# Patient Record
Sex: Female | Born: 1960 | Race: Black or African American | Hispanic: No | Marital: Single | Smoking: Never smoker
Health system: Southern US, Community
[De-identification: ages and names within clinical notes are randomized; demographics above are authoritative.]

## PROBLEM LIST (undated history)

## (undated) DIAGNOSIS — E119 Type 2 diabetes mellitus without complications: Secondary | ICD-10-CM

## (undated) DIAGNOSIS — E669 Obesity, unspecified: Secondary | ICD-10-CM

## (undated) DIAGNOSIS — E785 Hyperlipidemia, unspecified: Secondary | ICD-10-CM

## (undated) DIAGNOSIS — I10 Essential (primary) hypertension: Secondary | ICD-10-CM

## (undated) HISTORY — DX: Type 2 diabetes mellitus without complications: E11.9

## (undated) HISTORY — PX: WRIST SURGERY: SHX841

## (undated) HISTORY — DX: Hyperlipidemia, unspecified: E78.5

## (undated) HISTORY — DX: Obesity, unspecified: E66.9

## (undated) HISTORY — DX: Essential (primary) hypertension: I10

## (undated) HISTORY — PX: FOOT SURGERY: SHX648

---

## 2005-07-17 ENCOUNTER — Encounter: Admission: RE | Admit: 2005-07-17 | Discharge: 2005-07-31 | Payer: Self-pay | Admitting: Family Medicine

## 2005-12-16 ENCOUNTER — Other Ambulatory Visit: Admission: RE | Admit: 2005-12-16 | Discharge: 2005-12-16 | Payer: Self-pay | Admitting: Family Medicine

## 2006-06-11 ENCOUNTER — Encounter: Admission: RE | Admit: 2006-06-11 | Discharge: 2006-07-15 | Payer: Self-pay | Admitting: Family Medicine

## 2008-04-18 ENCOUNTER — Other Ambulatory Visit: Admission: RE | Admit: 2008-04-18 | Discharge: 2008-04-18 | Payer: Self-pay | Admitting: Family Medicine

## 2008-06-15 ENCOUNTER — Encounter: Admission: RE | Admit: 2008-06-15 | Discharge: 2008-07-27 | Payer: Self-pay | Admitting: Family Medicine

## 2008-10-19 ENCOUNTER — Encounter: Admission: RE | Admit: 2008-10-19 | Discharge: 2008-10-19 | Payer: Self-pay | Admitting: Family Medicine

## 2013-01-26 ENCOUNTER — Encounter: Payer: Federal, State, Local not specified - PPO | Attending: Family Medicine | Admitting: *Deleted

## 2013-01-26 VITALS — Ht 69.0 in | Wt 295.0 lb

## 2013-01-26 DIAGNOSIS — Z713 Dietary counseling and surveillance: Secondary | ICD-10-CM | POA: Insufficient documentation

## 2013-01-26 DIAGNOSIS — E119 Type 2 diabetes mellitus without complications: Secondary | ICD-10-CM

## 2013-01-26 NOTE — Patient Instructions (Signed)
Plan:  Aim for 3 Carb Choices per meal (45 grams) +/- 1 either way  Aim for 0-15 Carbs per snack if hungry  Consider reading food labels for Total Carbohydrate and Fat Grams of foods Continue your activity level by walking for 30 minutes daily as tolerated Consider checking BG at alternate times per day to include fasting and 2 hours after the beginning of your meal as directed by MD  Continue taking medication as directed by MD

## 2013-01-26 NOTE — Progress Notes (Signed)
Appt start time: 1530 end time:  1700.  Assessment:  Patient was seen on  01/26/13 for individual diabetes education. Kristin Aguilar was diagnosis with T2DM 09/2009 at which time she met with a dietitian. She followed a meal plan and lost 40#. She notes that " I screwed up". Kristin Aguilar's nephew lives with her. She does all the shopping and cooking herself. She does occasionally go to [employer] and [employer]. Growing up her father owned 2 [employer] She tests her glucose FBS daily and occasionally test post meal. Was previously experiencing hypoglycemia while taking glyburide. Has now changed to glimeperide and avg FBS 90-145mg /dl.  Current HbA1c: 7.3%  Preferred Learning Style:   Auditory  Visual  Hands on  Learning Readiness:   Ready  Change in progress  MEDICATIONS: See List: Metformin, Glimeperide  DIETARY INTAKE:  B ( AM): sausage, bread,....small grits  Snk ( AM): crackers, fig newtons  L ( PM): baked chicken/beef roast, chili, corned beef......salads, greens, brocolli Snk ( PM): fig newton D ( PM): baked chicken/beef roast, chili, corned beef, brown rice, wheat pasta..... Salads, greens, brocolli Snk ( PM): 1/2C ice cream [employer] Strawberry Beverages: diet soda/caffeine, water, Fast food [employer] 1X every other week, [employer]  Usual physical activity: walking 15 minutes 2X daily  Intervention:  Nutrition counseling provided.  Discussed diabetes disease process and treatment options.  Discussed physiology of diabetes and role of obesity on insulin resistance.  Encouraged moderate weight reduction to improve glucose levels.  Discussed role of medications and diet in glucose control  Provided education on macronutrients on glucose levels.  Provided education on carb counting, importance of regularly scheduled meals/snacks, and meal planning  Discussed effects of physical activity on glucose levels and long-term glucose control.  Recommended 150 minutes of physical  activity/week.  Reviewed patient medications.  Discussed role of medication on blood glucose and possible side effects  Discussed blood glucose monitoring and interpretation.  Discussed recommended target ranges and individual ranges.    Described short-term complications: hyper- and hypo-glycemia.  Discussed causes,symptoms, and treatment options.  Discussed prevention, detection, and treatment of long-term complications.  Discussed the role of prolonged elevated glucose levels on body systems.  Discussed role of stress on blood glucose levels and discussed strategies to manage psychosocial issues.  Discussed recommendations for long-term diabetes self-care.  Established checklist for medical, dental, and emotional self-care.  Plan:  Aim for 3 Carb Choices per meal (45 grams) +/- 1 either way  Aim for 0-15 Carbs per snack if hungry  Consider reading food labels for Total Carbohydrate and Fat Grams of foods Continue your activity level by walking for 30 minutes daily as tolerated Consider checking BG at alternate times per day to include fasting and 2 hours after the beginning of your meal as directed by MD  Continue taking medication as directed by MD  Teaching Method Utilized:  Visual Auditory Hands on  Handouts given during visit include: Living Well with Diabetes Carb Counting and Food Label handouts Meal Plan Card Plate Method Snack sheet  Barriers to learning/adherence to lifestyle change: 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 She generally buys items that she does not like so that she will not be tempted.  Diabetes self-care support plan:   [facility] support group  PCP  Co-workers  CDE  Demonstrated degree of understanding via:  Teach Back   Monitoring/Evaluation:  Dietary intake, exercise, test glucose and log, and body weight in 2 month(s).

## 2013-01-27 ENCOUNTER — Encounter: Payer: Self-pay | Admitting: *Deleted

## 2013-03-30 ENCOUNTER — Ambulatory Visit: Payer: Federal, State, Local not specified - PPO | Admitting: *Deleted

## 2013-06-14 ENCOUNTER — Other Ambulatory Visit: Payer: Self-pay | Admitting: Family Medicine

## 2013-06-14 ENCOUNTER — Other Ambulatory Visit (HOSPITAL_COMMUNITY)
Admission: RE | Admit: 2013-06-14 | Discharge: 2013-06-14 | Disposition: A | Discharge status: Home / Self Care | Payer: Federal, State, Local not specified - PPO | Source: Ambulatory Visit | Attending: Family Medicine | Admitting: Family Medicine

## 2013-06-14 DIAGNOSIS — Z113 Encounter for screening for infections with a predominantly sexual mode of transmission: Secondary | ICD-10-CM | POA: Insufficient documentation

## 2013-06-14 DIAGNOSIS — Z01419 Encounter for gynecological examination (general) (routine) without abnormal findings: Secondary | ICD-10-CM | POA: Insufficient documentation

## 2013-06-14 DIAGNOSIS — Z1151 Encounter for screening for human papillomavirus (HPV): Secondary | ICD-10-CM | POA: Insufficient documentation

## 2013-07-15 ENCOUNTER — Other Ambulatory Visit: Payer: Self-pay | Admitting: Gastroenterology

## 2015-02-12 ENCOUNTER — Encounter (HOSPITAL_COMMUNITY): Payer: Self-pay | Admitting: *Deleted

## 2015-02-12 ENCOUNTER — Emergency Department (INDEPENDENT_AMBULATORY_CARE_PROVIDER_SITE_OTHER)
Admission: EM | Admit: 2015-02-12 | Discharge: 2015-02-12 | Disposition: A | Discharge status: Other Acute Inpatient Hospital | Payer: Federal, State, Local not specified - PPO | Source: Home / Self Care | Attending: Family Medicine | Admitting: Family Medicine

## 2015-02-12 ENCOUNTER — Emergency Department (HOSPITAL_COMMUNITY)
Admission: EM | Admit: 2015-02-12 | Discharge: 2015-02-12 | Disposition: A | Discharge status: Home / Self Care | Payer: Federal, State, Local not specified - PPO | Attending: Emergency Medicine | Admitting: Emergency Medicine

## 2015-02-12 ENCOUNTER — Emergency Department (HOSPITAL_COMMUNITY): Payer: Federal, State, Local not specified - PPO

## 2015-02-12 DIAGNOSIS — R194 Change in bowel habit: Secondary | ICD-10-CM | POA: Diagnosis not present

## 2015-02-12 DIAGNOSIS — E785 Hyperlipidemia, unspecified: Secondary | ICD-10-CM | POA: Insufficient documentation

## 2015-02-12 DIAGNOSIS — R109 Unspecified abdominal pain: Secondary | ICD-10-CM

## 2015-02-12 DIAGNOSIS — R197 Diarrhea, unspecified: Secondary | ICD-10-CM | POA: Diagnosis not present

## 2015-02-12 DIAGNOSIS — D259 Leiomyoma of uterus, unspecified: Secondary | ICD-10-CM | POA: Diagnosis not present

## 2015-02-12 DIAGNOSIS — N289 Disorder of kidney and ureter, unspecified: Secondary | ICD-10-CM | POA: Insufficient documentation

## 2015-02-12 DIAGNOSIS — E1165 Type 2 diabetes mellitus with hyperglycemia: Secondary | ICD-10-CM | POA: Insufficient documentation

## 2015-02-12 DIAGNOSIS — N83202 Unspecified ovarian cyst, left side: Secondary | ICD-10-CM | POA: Insufficient documentation

## 2015-02-12 DIAGNOSIS — D72829 Elevated white blood cell count, unspecified: Secondary | ICD-10-CM | POA: Insufficient documentation

## 2015-02-12 DIAGNOSIS — I10 Essential (primary) hypertension: Secondary | ICD-10-CM | POA: Insufficient documentation

## 2015-02-12 DIAGNOSIS — E669 Obesity, unspecified: Secondary | ICD-10-CM | POA: Diagnosis not present

## 2015-02-12 DIAGNOSIS — Z3202 Encounter for pregnancy test, result negative: Secondary | ICD-10-CM | POA: Insufficient documentation

## 2015-02-12 DIAGNOSIS — Z79899 Other long term (current) drug therapy: Secondary | ICD-10-CM | POA: Insufficient documentation

## 2015-02-12 DIAGNOSIS — Z7982 Long term (current) use of aspirin: Secondary | ICD-10-CM | POA: Insufficient documentation

## 2015-02-12 DIAGNOSIS — Z7984 Long term (current) use of oral hypoglycemic drugs: Secondary | ICD-10-CM | POA: Insufficient documentation

## 2015-02-12 LAB — COMPREHENSIVE METABOLIC PANEL
ALK PHOS: 93 U/L (ref 38–126)
ALT: 19 U/L (ref 14–54)
AST: 15 U/L (ref 15–41)
Albumin: 3.7 g/dL (ref 3.5–5.0)
Anion gap: 9 (ref 5–15)
BILIRUBIN TOTAL: 0.5 mg/dL (ref 0.3–1.2)
BUN: 11 mg/dL (ref 6–20)
CALCIUM: 9.4 mg/dL (ref 8.9–10.3)
CO2: 29 mmol/L (ref 22–32)
CREATININE: 1.04 mg/dL — AB (ref 0.44–1.00)
Chloride: 101 mmol/L (ref 101–111)
GFR, EST NON AFRICAN AMERICAN: 60 mL/min — AB (ref >60)
Glucose, Bld: 122 mg/dL — ABNORMAL HIGH (ref 65–99)
Potassium: 3.5 mmol/L (ref 3.5–5.1)
SODIUM: 139 mmol/L (ref 135–145)
TOTAL PROTEIN: 7.2 g/dL (ref 6.5–8.1)

## 2015-02-12 LAB — I-STAT BETA HCG BLOOD, ED (MC, WL, AP ONLY)

## 2015-02-12 LAB — URINE MICROSCOPIC-ADD ON

## 2015-02-12 LAB — URINALYSIS, ROUTINE W REFLEX MICROSCOPIC
Glucose, UA: NEGATIVE mg/dL
Hgb urine dipstick: NEGATIVE
KETONES UR: 15 mg/dL — AB
Nitrite: NEGATIVE
PROTEIN: NEGATIVE mg/dL
Specific Gravity, Urine: 1.021 (ref 1.005–1.030)
pH: 5.5 (ref 5.0–8.0)

## 2015-02-12 LAB — CBC
HCT: 44.1 % (ref 36.0–46.0)
Hemoglobin: 14.2 g/dL (ref 12.0–15.0)
MCH: 30 pg (ref 26.0–34.0)
MCHC: 32.2 g/dL (ref 30.0–36.0)
MCV: 93.2 fL (ref 78.0–100.0)
PLATELETS: 322 10*3/uL (ref 150–400)
RBC: 4.73 MIL/uL (ref 3.87–5.11)
RDW: 12.3 % (ref 11.5–15.5)
WBC: 11.3 10*3/uL — AB (ref 4.0–10.5)

## 2015-02-12 LAB — LIPASE, BLOOD: Lipase: 32 U/L (ref 11–51)

## 2015-02-12 MED ORDER — DICYCLOMINE HCL 20 MG PO TABS: 20.0000 mg | ORAL_TABLET | Freq: Three times a day (TID) | ORAL | Status: DC | PRN

## 2015-02-12 MED ORDER — IOHEXOL 300 MG/ML  SOLN
100.0000 mL | Freq: Once | INTRAMUSCULAR | Status: AC | PRN
  Administered 2015-02-12: 100 mL via INTRAVENOUS

## 2015-02-12 MED ORDER — SODIUM CHLORIDE 0.9 % IV BOLUS (SEPSIS)
1000.0000 mL | Freq: Once | INTRAVENOUS | Status: AC
  Administered 2015-02-12: 1000 mL via INTRAVENOUS

## 2015-02-12 MED ORDER — PSYLLIUM 28 % PO PACK: 1.0000 | PACK | Freq: Two times a day (BID) | ORAL | Status: AC

## 2015-02-12 MED ORDER — PSYLLIUM 28 % PO PACK: 1.0000 | PACK | Freq: Two times a day (BID) | ORAL | Status: DC

## 2015-02-12 NOTE — Discharge Instructions (Signed)
Read the information below.  Use the prescribed medication as directed.  Please discuss all new medications with your pharmacist.  You may return to the Emergency Department at any time for worsening condition or any new symptoms that concern you.    If you develop high fevers, worsening abdominal pain, uncontrolled vomiting, or are unable to tolerate fluids by mouth, return to the ER for a recheck.     Abdominal Pain, Adult Many things can cause abdominal pain. Usually, abdominal pain is not caused by a disease and will improve without treatment. It can often be observed and treated at home. Your health care provider will do a physical exam and possibly order blood tests and X-rays to help determine the seriousness of your pain. However, in many cases, more time must pass before a clear cause of the pain can be found. Before that point, your health care provider may not know if you need more testing or further treatment. HOME CARE INSTRUCTIONS Monitor your abdominal pain for any changes. The following actions may help to alleviate any discomfort you are experiencing:  Only take over-the-counter or prescription medicines as directed by your health care provider.  Do not take laxatives unless directed to do so by your health care provider.  Try a clear liquid diet (broth, tea, or water) as directed by your health care provider. Slowly move to a bland diet as tolerated. SEEK MEDICAL CARE IF:  You have unexplained abdominal pain.  You have abdominal pain associated with nausea or diarrhea.  You have pain when you urinate or have a bowel movement.  You experience abdominal pain that wakes you in the night.  You have abdominal pain that is worsened or improved by eating food.  You have abdominal pain that is worsened with eating fatty foods.  You have a fever. SEEK IMMEDIATE MEDICAL CARE IF:  Your pain does not go away within 2 hours.  You keep throwing up (vomiting).  Your pain is felt  only in portions of the abdomen, such as the right side or the left lower portion of the abdomen.  You pass bloody or black tarry stools. MAKE SURE YOU:  Understand these instructions.  Will watch your condition.  Will get help right away if you are not doing well or get worse.   This information is not intended to replace advice given to you by your health care provider. Make sure you discuss any questions you have with your health care provider.   Document Released: 11/07/2004 Document Revised: 10/19/2014 Document Reviewed: 10/07/2012 Elsevier Interactive Patient Education [employer].

## 2015-02-12 NOTE — ED Notes (Signed)
Pt sent here from [facility] for abd pain since wed. Denies n/v or urinary symptoms, reports mild diarrhea. No acute distress noted at triage.

## 2015-02-12 NOTE — ED Notes (Addendum)
Pt  Reports       Symptoms      Of    abd  Pain   -  Pt     Reports            Symptoms         Of        abd  Pain           With  Some  Nausea          And          Weakness          X  4-5   Days            Pt    Reports      Family  Member  Is  Ill  As  Well

## 2015-02-12 NOTE — ED Provider Notes (Signed)
CSN: [id number]     Arrival date & time 02/12/15  1800 History   First MD Initiated Contact with Patient 02/12/15 2025     Chief Complaint  Patient presents with  . Abdominal Pain     (Consider location/radiation/quality/duration/timing/severity/associated sxs/prior Treatment) The history is provided by the patient and medical records.     Pt with hx DM, obesity, HTN, HLD p/w left sided abdominal pain initially with diarrhea and now constipation x 5 days.  Pain is aching and knotted, constant, worse with eating.  5/10 currently, 8/10 at its worst.  Last Bm was yesterday morning and was very small.  Blood sugars are normally controlled between 70-80 and have been >200.  Has had some lower back pain and SOB with moving around.  Denies fevers, chills, myalgias, CP, URI symptoms, N/V, urinary or vaginal symptoms.  She has never had abdominal surgery.    Past Medical History  Diagnosis Date  . Diabetes mellitus without complication ([place])   . Hyperlipidemia   . Hypertension   . Obesity    History reviewed. No pertinent past surgical history. History reviewed. No pertinent family history. Social History  Substance Use Topics  . Smoking status: None  . Smokeless tobacco: None  . Alcohol Use: No   OB History    No data available     Review of Systems  All other systems reviewed and are negative.     Allergies  Lactose intolerance (gi); Lisinopril; Sulfa antibiotics; and Doxycycline hyclate  Home Medications   Prior to Admission medications   Medication Sig Start Date End Date Taking? Authorizing Provider  aspirin 81 MG tablet Take 81 mg by mouth daily.    Historical Provider, MD  glimepiride (AMARYL) 2 MG tablet Take 2 mg by mouth daily with breakfast.    Historical Provider, MD  losartan (COZAAR) 25 MG tablet Take 25 mg by mouth daily.    Historical Provider, MD  metformin (FORTAMET) 500 MG (OSM) 24 hr tablet Take 1,000 mg by mouth daily with supper.    Historical Provider,  MD  rosuvastatin (CRESTOR) 10 MG tablet Take 10 mg by mouth daily.    Historical Provider, MD   BP 180/79 mmHg  Pulse 86  Temp(Src) 97.8 F (36.6 C) (Oral)  Resp 16  SpO2 100%  LMP 01/26/2015 Physical Exam  Constitutional: She appears well-developed and well-nourished. No distress.  HENT:  Head: Normocephalic and atraumatic.  Neck: Neck supple.  Cardiovascular: Normal rate and regular rhythm.   Pulmonary/Chest: Effort normal and breath sounds normal. No respiratory distress. She has no wheezes. She has no rales.  Abdominal: Soft. She exhibits no distension. There is tenderness in the left upper quadrant and left lower quadrant. There is no rebound, no guarding and no CVA tenderness.  Neurological: She is alert.  Skin: She is not diaphoretic.  Nursing note and vitals reviewed.   ED Course  Procedures (including critical care time) Labs Review Labs Reviewed  COMPREHENSIVE METABOLIC PANEL - Abnormal; Notable for the following:    Glucose, Bld 122 (*)    Creatinine, Ser 1.04 (*)    GFR calc non Af Amer 60 (*)    All other components within normal limits  CBC - Abnormal; Notable for the following:    WBC 11.3 (*)    All other components within normal limits  URINALYSIS, ROUTINE W REFLEX MICROSCOPIC (NOT AT [facility]) - Abnormal; Notable for the following:    Color, Urine AMBER (*)    APPearance CLOUDY (*)  Bilirubin Urine SMALL (*)    Ketones, ur 15 (*)    Leukocytes, UA TRACE (*)    All other components within normal limits  URINE MICROSCOPIC-ADD ON - Abnormal; Notable for the following:    Squamous Epithelial / LPF 6-30 (*)    Bacteria, UA MANY (*)    All other components within normal limits  LIPASE, BLOOD  I-STAT BETA HCG BLOOD, ED ([facility], [facility], [facility] ONLY)    Imaging Review Ct Abdomen Pelvis W Contrast  02/12/2015  CLINICAL DATA:  Subacute onset of left upper quadrant and left lower quadrant abdominal pain and diarrhea. Initial encounter. EXAM: CT ABDOMEN AND PELVIS WITH  CONTRAST TECHNIQUE: Multidetector CT imaging of the abdomen and pelvis was performed using the standard protocol following bolus administration of intravenous contrast. CONTRAST:  136mL OMNIPAQUE IOHEXOL 300 MG/ML  SOLN COMPARISON:  None. FINDINGS: The visualized lung bases are clear. A 5 mm nonspecific hypodensity is noted within the right hepatic lobe. The liver is otherwise unremarkable. The gallbladder is within normal limits. The pancreas and adrenal glands are unremarkable. The kidneys are unremarkable in appearance. There is no evidence of hydronephrosis. No renal or ureteral stones are seen. No perinephric stranding is appreciated. No free fluid is identified. The small bowel is unremarkable in appearance. The stomach is within normal limits. No acute vascular abnormalities are seen. The appendix is normal in caliber and contains air, without evidence of appendicitis. The colon is unremarkable in appearance. The bladder is mildly distended and grossly unremarkable. A large 7.4 cm fibroid is noted at the uterine fundus. The intrauterine device is significantly displaced as a result. A 3.2 cm left adnexal cystic focus is noted. The ovaries are otherwise unremarkable. No inguinal lymphadenopathy is seen. No acute osseous abnormalities are identified. Mild endplate degenerative change is noted at L4-L5. IMPRESSION: 1. No acute abnormality seen within the abdomen or pelvis. 2. Large 7.4 cm uterine fibroid noted. Intrauterine device is significantly displaced as a result. 3. 3.2 cm left adnexal cystic focus is likely physiologic, though pelvic ultrasound could be considered for further evaluation on an elective nonemergent basis. 4. 5 mm nonspecific hypodensity within the right hepatic lobe. Electronically Signed   By: Garald Balding M.D.   On: 02/12/2015 22:47      EKG Interpretation None      MDM   Final diagnoses:  Left sided abdominal pain  Uterine leiomyoma, unspecified location  Left ovarian  cyst  Bowel habit changes    Afebrile nontoxic patient with 5 days of left sided abdominal pain, diarrhea and now constipation sent from urgent care.  Labs significant for mild leukocytosis (11.3), hyperglycemia (122), renal insufficiency (creatinine 1.04).  Given IVF.  Pt declined pain medications.  CT abd/pelvis negative for acute findings.  Pt does have fibroid, left ovarian cyst and 30mm nonspecific hypodensity in liver, which I have discussed with patient.  Pt's PCP also acts as her gyn, will follow up with her.  D/C home with bentyl, metamucil, PCP follow up.  Discussed result, findings, treatment, and follow up  with patient.  Pt given return precautions.  Pt verbalizes understanding and agrees with plan.         [place], PA-C 02/13/15 [id number]  Varney Biles, MD 02/15/15 2348

## 2015-02-12 NOTE — ED Provider Notes (Signed)
CSN: [id number]     Arrival date & time 02/12/15  1435 History   First MD Initiated Contact with Patient 02/12/15 1659     Chief Complaint  Patient presents with  . Abdominal Pain   (Consider location/radiation/quality/duration/timing/severity/associated sxs/prior Treatment) Patient is a 55 y.o. female presenting with abdominal pain. The history is provided by the patient.  Abdominal Pain Pain location:  L flank Pain quality: cramping   Pain radiates to:  Does not radiate Pain severity:  Moderate Onset quality:  Sudden Duration:  5 days Progression:  Worsening Chronicity:  New Ineffective treatments:  Liquids and palpation Associated symptoms: diarrhea   Associated symptoms: no constipation, no dysuria, no fever, no hematemesis, no hematochezia, no melena, no nausea and no vomiting   Risk factors: obesity   Risk factors comment:  Diabetic with elevated sugars.   Past Medical History  Diagnosis Date  . Diabetes mellitus without complication ([place])   . Hyperlipidemia   . Hypertension   . Obesity    History reviewed. No pertinent past surgical history. History reviewed. No pertinent family history. Social History  Substance Use Topics  . Smoking status: None  . Smokeless tobacco: None  . Alcohol Use: No   OB History    No data available     Review of Systems  Constitutional: Positive for appetite change. Negative for fever.  HENT: Negative.   Respiratory: Negative.   Cardiovascular: Negative.   Gastrointestinal: Positive for abdominal pain and diarrhea. Negative for nausea, vomiting, constipation, blood in stool, melena, hematochezia and hematemesis.  Genitourinary: Negative for dysuria.  All other systems reviewed and are negative.   Allergies  Lactose intolerance (gi); Lisinopril; Sulfa antibiotics; and Doxycycline hyclate  Home Medications   Prior to Admission medications   Medication Sig Start Date End Date Taking? Authorizing Provider  aspirin 81 MG tablet  Take 81 mg by mouth daily.    Historical Provider, MD  glimepiride (AMARYL) 2 MG tablet Take 2 mg by mouth daily with breakfast.    Historical Provider, MD  losartan (COZAAR) 25 MG tablet Take 25 mg by mouth daily.    Historical Provider, MD  metformin (FORTAMET) 500 MG (OSM) 24 hr tablet Take 1,000 mg by mouth daily with supper.    Historical Provider, MD  rosuvastatin (CRESTOR) 10 MG tablet Take 10 mg by mouth daily.    Historical Provider, MD   Meds Ordered and Administered this Visit  Medications - No data to display  BP 168/88 mmHg  Pulse 79  Temp(Src) 98 F (36.7 C) (Oral)  Resp 20  SpO2 100%  LMP 01/26/2015 No data found.   Physical Exam  Constitutional: She is oriented to person, place, and time. She appears well-developed and well-nourished. She appears distressed.  Neck: Normal range of motion. Neck supple.  Cardiovascular: Normal heart sounds.   Pulmonary/Chest: Breath sounds normal.  Abdominal: Soft. Bowel sounds are normal. She exhibits no mass. There is tenderness in the left upper quadrant and left lower quadrant. There is no rebound, no guarding and no CVA tenderness.  Lymphadenopathy:    She has no cervical adenopathy.  Neurological: She is alert and oriented to person, place, and time.  Skin: Skin is warm and dry.  Nursing note and vitals reviewed.   ED Course  Procedures (including critical care time)  Labs Review Labs Reviewed - No data to display  Imaging Review No results found.   Visual Acuity Review  Right Eye Distance:   Left Eye Distance:  Bilateral Distance:    Right Eye Near:   Left Eye Near:    Bilateral Near:         MDM   1. Abdominal pain in female patient    Sent for eval of persistent left abd pain after 1 day of diarrhea on wed, no vomiting, no fever, is diabetic with bs out of control, in spite of dietary measures.    Billy Fischer, MD 02/12/15 [phone]

## 2015-02-12 NOTE — ED Notes (Signed)
PA at bedside.

## 2015-04-01 ENCOUNTER — Emergency Department (HOSPITAL_COMMUNITY)
Admission: EM | Admit: 2015-04-01 | Discharge: 2015-04-01 | Disposition: A | Discharge status: Home / Self Care | Payer: Federal, State, Local not specified - PPO | Source: Home / Self Care | Attending: Emergency Medicine | Admitting: Emergency Medicine

## 2015-04-01 ENCOUNTER — Encounter (HOSPITAL_COMMUNITY): Payer: Self-pay | Admitting: *Deleted

## 2015-04-01 DIAGNOSIS — M542 Cervicalgia: Secondary | ICD-10-CM

## 2015-04-01 DIAGNOSIS — M6248 Contracture of muscle, other site: Secondary | ICD-10-CM

## 2015-04-01 DIAGNOSIS — M62838 Other muscle spasm: Secondary | ICD-10-CM

## 2015-04-01 MED ORDER — DICLOFENAC SODIUM 75 MG PO TBEC: 75.0000 mg | DELAYED_RELEASE_TABLET | Freq: Two times a day (BID) | ORAL | Status: AC

## 2015-04-01 MED ORDER — METAXALONE 800 MG PO TABS: 800.0000 mg | ORAL_TABLET | Freq: Three times a day (TID) | ORAL | Status: AC

## 2015-04-01 MED ORDER — HYDROCODONE-ACETAMINOPHEN 5-325 MG PO TABS: 2.0000 | ORAL_TABLET | ORAL | Status: AC | PRN

## 2015-04-01 NOTE — ED Provider Notes (Signed)
HPI  SUBJECTIVE:  Kristin Aguilar is a 55 y.o. female who presents with several days of constant right-sided neck pain described as soreness, muscle spasm. She reports occasional radiation down to her lateral shoulder. Symptoms are worse with turning her head to the right and left, slightly better with heat. She tried ointment, heat pad, ice, Aleve, Tylenol arthritis, home massage without much improvement. No nausea, vomiting, fevers, trauma to her neck. No numbness, tingling, arm or grip weakness. No rash. Past medical history of "neck arthritis" states this is similar to previous flares, but more intense than usual. States usually symptoms resolve with physical therapy, chiropractor. also PMH of diabetes, hypertension. No history of cancer, osteoporosis. PMD: Cari Caraway.   Past Medical History  Diagnosis Date  . Diabetes mellitus without complication ([place])   . Hyperlipidemia   . Hypertension   . Obesity     Past Surgical History  Procedure Laterality Date  . Foot surgery    . Wrist surgery      No family history on file.  Social History  Substance Use Topics  . Smoking status: Never Smoker   . Smokeless tobacco: None  . Alcohol Use: No    No current facility-administered medications for this encounter.  Current outpatient prescriptions:  .  aspirin 81 MG tablet, Take 81 mg by mouth daily., Disp: , Rfl:  .  BIOTIN PO, Take 1 tablet by mouth daily., Disp: , Rfl:  .  Calcium Citrate-Vitamin D (CALCIUM CITRATE+D3 PETITES PO), Take 1 tablet by mouth daily., Disp: , Rfl:  .  DRYSOL 20 % external solution, Apply 1 application topically every evening., Disp: , Rfl: 6 .  fluticasone (FLONASE) 50 MCG/ACT nasal spray, Place 1 spray into both nostrils daily., Disp: , Rfl:  .  glimepiride (AMARYL) 2 MG tablet, Take 2 mg by mouth daily with breakfast., Disp: , Rfl:  .  losartan (COZAAR) 100 MG tablet, Take 100 mg by mouth daily., Disp: , Rfl: 6 .  metformin (FORTAMET) 500 MG (OSM) 24 hr  tablet, Take 1,000 mg by mouth daily with supper., Disp: , Rfl:  .  Multiple Vitamin (MULTIVITAMIN) tablet, Take 1 tablet by mouth daily., Disp: , Rfl:  .  psyllium (METAMUCIL SMOOTH TEXTURE) 28 % packet, Take 1 packet by mouth 2 (two) times daily., Disp: 30 packet, Rfl: 0 .  rosuvastatin (CRESTOR) 20 MG tablet, Take 10 mg by mouth daily., Disp: , Rfl: 6 .  diclofenac (VOLTAREN) 75 MG EC tablet, Take 1 tablet (75 mg total) by mouth 2 (two) times daily. Take with food, Disp: 30 tablet, Rfl: 0 .  HYDROcodone-acetaminophen (NORCO/VICODIN) 5-325 MG tablet, Take 2 tablets by mouth every 4 (four) hours as needed for moderate pain., Disp: 20 tablet, Rfl: 0 .  metaxalone (SKELAXIN) 800 MG tablet, Take 1 tablet (800 mg total) by mouth 3 (three) times daily., Disp: 21 tablet, Rfl: 0 .  [DISCONTINUED] dicyclomine (BENTYL) 20 MG tablet, Take 1 tablet (20 mg total) by mouth 3 (three) times daily as needed for spasms (abdominal pain)., Disp: 20 tablet, Rfl: 0  Allergies  Allergen Reactions  . Lactose Intolerance (Gi) Diarrhea  . Lisinopril Cough  . Sulfa Antibiotics Hives    "[place]"  . Doxycycline Hyclate Rash     ROS  As noted in HPI.   Physical Exam  BP 166/104 mmHg  Pulse 106  Temp(Src) 99.1 F (37.3 C) (Oral)  SpO2 96%  LMP 03/07/2015 (Exact Date)  BP Readings from Last 3 Encounters:  04/01/15 166/104  02/12/15 156/82  02/12/15 168/88    Constitutional: Well developed, well nourished, no acute distress Eyes:  EOMI, conjunctiva normal bilaterally HENT: Normocephalic, atraumatic,mucus membranes moist Respiratory: Normal inspiratory effort Cardiovascular: Normal rate GI: nondistended skin: No rash, skin intact Musculoskeletal: Right trapezial spasm, tenderness. No tenderness along C-spine.  pain aggravated with head rotation to the right more than the left. Patient able flex/extend neck fully. No rash. Shoulder exam full range of motion intact, right elbow, proximal, distal arm exam  normal. grip strength equal bilaterally. RP 2+ and equal bilaterally. Neurologic : Alert & oriented x 3, no focal neuro deficits Psychiatric: Speech and behavior appropriate   ED Course   Medications - No data to display  Orders Placed This Encounter  Procedures  . Ambulatory referral to Physical Therapy    Referral Priority:  Urgent    Referral Type:  Physical Medicine    Referral Reason:  Specialty Services Required    Requested Specialty:  Physical Therapy    Number of Visits Requested:  1    No results found for this or any previous visit (from the past 24 hour(s)). No results found.  ED Clinical Impression  Trapezius muscle spasm  Neck pain   ED Assessment/Plan  In the absence of trauma or neurologic findings, deferring imaging today. Home with Norco, diclofenac, muscle relaxant, referral to physical therapy. She is to follow-up with her primary care physician and her chiropractor. Advised continued massage.    Blood pressure noted. Repeat blood pressure 160s over 100s. Patient has no symptoms of hypertensive emergency. Feel this is most likely from pain. Advised patient to keep an eye on her blood pressure and discussed signs and symptoms of hypertensive emergency that should prompt return to the ER.   Discussed MDM, plan and followup with patient. Discussed sn/sx that should prompt return to the UC or ED. Patient agrees with plan.   *This clinic note was created using Dragon dictation software. Therefore, there may be occasional mistakes despite careful proofreading.  ?    Melynda Ripple, MD 04/01/15 [phone]

## 2015-04-01 NOTE — ED Notes (Signed)
C/O severe flare-up neck arthritis since 2/15.  C/O right neck pain radiating down into right shoulder.  Has tried heating pad, massage tool, Tyl, Aleve, and pain relief cream without relief.  Denies any parasthesias.

## 2015-04-01 NOTE — Discharge Instructions (Signed)
Stop the Aleve. Try the diclofenac. Norco for severe pain only. Follow-up with your chiropractor, primary care physician, in physical therapy. They should call you within several days to schedule an appointment.

## 2017-04-07 DIAGNOSIS — M50322 Other cervical disc degeneration at C5-C6 level: Secondary | ICD-10-CM | POA: Diagnosis not present

## 2017-04-07 DIAGNOSIS — M9902 Segmental and somatic dysfunction of thoracic region: Secondary | ICD-10-CM | POA: Diagnosis not present

## 2017-04-07 DIAGNOSIS — M50321 Other cervical disc degeneration at C4-C5 level: Secondary | ICD-10-CM | POA: Diagnosis not present

## 2017-04-07 DIAGNOSIS — M9901 Segmental and somatic dysfunction of cervical region: Secondary | ICD-10-CM | POA: Diagnosis not present

## 2017-04-08 DIAGNOSIS — K080 Exfoliation of teeth due to systemic causes: Secondary | ICD-10-CM | POA: Diagnosis not present

## 2017-05-07 DIAGNOSIS — L718 Other rosacea: Secondary | ICD-10-CM | POA: Diagnosis not present

## 2017-05-07 DIAGNOSIS — L821 Other seborrheic keratosis: Secondary | ICD-10-CM | POA: Diagnosis not present

## 2017-05-07 DIAGNOSIS — L239 Allergic contact dermatitis, unspecified cause: Secondary | ICD-10-CM | POA: Diagnosis not present

## 2017-05-07 DIAGNOSIS — D2372 Other benign neoplasm of skin of left lower limb, including hip: Secondary | ICD-10-CM | POA: Diagnosis not present

## 2017-05-20 DIAGNOSIS — M205X2 Other deformities of toe(s) (acquired), left foot: Secondary | ICD-10-CM | POA: Diagnosis not present

## 2017-05-20 DIAGNOSIS — M21961 Unspecified acquired deformity of right lower leg: Secondary | ICD-10-CM | POA: Diagnosis not present

## 2017-05-20 DIAGNOSIS — M79671 Pain in right foot: Secondary | ICD-10-CM | POA: Diagnosis not present

## 2017-05-20 DIAGNOSIS — M79672 Pain in left foot: Secondary | ICD-10-CM | POA: Diagnosis not present

## 2017-05-21 DIAGNOSIS — E119 Type 2 diabetes mellitus without complications: Secondary | ICD-10-CM | POA: Diagnosis not present

## 2017-05-21 DIAGNOSIS — Z6841 Body Mass Index (BMI) 40.0 and over, adult: Secondary | ICD-10-CM | POA: Diagnosis not present

## 2017-05-21 DIAGNOSIS — Z7984 Long term (current) use of oral hypoglycemic drugs: Secondary | ICD-10-CM | POA: Diagnosis not present

## 2017-05-21 DIAGNOSIS — R0602 Shortness of breath: Secondary | ICD-10-CM | POA: Diagnosis not present

## 2017-05-23 ENCOUNTER — Other Ambulatory Visit: Payer: Self-pay | Admitting: Surgical Oncology

## 2017-05-23 DIAGNOSIS — K449 Diaphragmatic hernia without obstruction or gangrene: Secondary | ICD-10-CM

## 2017-05-27 ENCOUNTER — Ambulatory Visit
Admission: RE | Admit: 2017-05-27 | Discharge: 2017-05-27 | Disposition: A | Discharge status: Home / Self Care | Payer: Federal, State, Local not specified - PPO | Source: Ambulatory Visit | Attending: Surgical Oncology | Admitting: Surgical Oncology

## 2017-05-27 DIAGNOSIS — K449 Diaphragmatic hernia without obstruction or gangrene: Secondary | ICD-10-CM | POA: Diagnosis not present

## 2017-06-02 DIAGNOSIS — M205X2 Other deformities of toe(s) (acquired), left foot: Secondary | ICD-10-CM | POA: Diagnosis not present

## 2017-06-02 DIAGNOSIS — M7752 Other enthesopathy of left foot: Secondary | ICD-10-CM | POA: Diagnosis not present

## 2017-06-03 DIAGNOSIS — Z713 Dietary counseling and surveillance: Secondary | ICD-10-CM | POA: Diagnosis not present

## 2017-06-03 DIAGNOSIS — Z6841 Body Mass Index (BMI) 40.0 and over, adult: Secondary | ICD-10-CM | POA: Diagnosis not present

## 2017-06-24 DIAGNOSIS — Z713 Dietary counseling and surveillance: Secondary | ICD-10-CM | POA: Diagnosis not present

## 2017-07-22 DIAGNOSIS — E139 Other specified diabetes mellitus without complications: Secondary | ICD-10-CM | POA: Diagnosis not present

## 2017-07-22 DIAGNOSIS — M7752 Other enthesopathy of left foot: Secondary | ICD-10-CM | POA: Diagnosis not present

## 2017-07-22 DIAGNOSIS — M205X2 Other deformities of toe(s) (acquired), left foot: Secondary | ICD-10-CM | POA: Diagnosis not present

## 2017-07-24 DIAGNOSIS — Z6841 Body Mass Index (BMI) 40.0 and over, adult: Secondary | ICD-10-CM | POA: Diagnosis not present

## 2017-07-24 DIAGNOSIS — Z713 Dietary counseling and surveillance: Secondary | ICD-10-CM | POA: Diagnosis not present

## 2017-08-04 DIAGNOSIS — Z1231 Encounter for screening mammogram for malignant neoplasm of breast: Secondary | ICD-10-CM | POA: Diagnosis not present

## 2017-08-11 DIAGNOSIS — Z6841 Body Mass Index (BMI) 40.0 and over, adult: Secondary | ICD-10-CM | POA: Diagnosis not present

## 2017-08-11 DIAGNOSIS — Z634 Disappearance and death of family member: Secondary | ICD-10-CM | POA: Diagnosis not present

## 2017-08-11 DIAGNOSIS — Z01818 Encounter for other preprocedural examination: Secondary | ICD-10-CM | POA: Diagnosis not present

## 2017-08-11 DIAGNOSIS — F4320 Adjustment disorder, unspecified: Secondary | ICD-10-CM | POA: Diagnosis not present

## 2017-08-11 DIAGNOSIS — R948 Abnormal results of function studies of other organs and systems: Secondary | ICD-10-CM | POA: Diagnosis not present

## 2017-08-25 DIAGNOSIS — Z713 Dietary counseling and surveillance: Secondary | ICD-10-CM | POA: Diagnosis not present

## 2017-08-25 DIAGNOSIS — Z112 Encounter for screening for other bacterial diseases: Secondary | ICD-10-CM | POA: Diagnosis not present

## 2017-08-25 DIAGNOSIS — Z6841 Body Mass Index (BMI) 40.0 and over, adult: Secondary | ICD-10-CM | POA: Diagnosis not present

## 2017-08-29 DIAGNOSIS — I10 Essential (primary) hypertension: Secondary | ICD-10-CM | POA: Diagnosis not present

## 2017-08-29 DIAGNOSIS — Z0000 Encounter for general adult medical examination without abnormal findings: Secondary | ICD-10-CM | POA: Diagnosis not present

## 2017-08-29 DIAGNOSIS — E559 Vitamin D deficiency, unspecified: Secondary | ICD-10-CM | POA: Diagnosis not present

## 2017-08-29 DIAGNOSIS — E1159 Type 2 diabetes mellitus with other circulatory complications: Secondary | ICD-10-CM | POA: Diagnosis not present

## 2017-08-29 DIAGNOSIS — E785 Hyperlipidemia, unspecified: Secondary | ICD-10-CM | POA: Diagnosis not present

## 2017-09-11 DIAGNOSIS — Z634 Disappearance and death of family member: Secondary | ICD-10-CM | POA: Diagnosis not present

## 2017-09-11 DIAGNOSIS — F4320 Adjustment disorder, unspecified: Secondary | ICD-10-CM | POA: Diagnosis not present

## 2017-10-21 DIAGNOSIS — I10 Essential (primary) hypertension: Secondary | ICD-10-CM | POA: Diagnosis not present

## 2017-10-21 DIAGNOSIS — R0683 Snoring: Secondary | ICD-10-CM | POA: Diagnosis not present

## 2017-10-21 DIAGNOSIS — G4700 Insomnia, unspecified: Secondary | ICD-10-CM | POA: Diagnosis not present

## 2017-10-23 DIAGNOSIS — F54 Psychological and behavioral factors associated with disorders or diseases classified elsewhere: Secondary | ICD-10-CM | POA: Diagnosis not present

## 2017-10-23 DIAGNOSIS — F4329 Adjustment disorder with other symptoms: Secondary | ICD-10-CM | POA: Diagnosis not present

## 2017-10-23 DIAGNOSIS — Z6841 Body Mass Index (BMI) 40.0 and over, adult: Secondary | ICD-10-CM | POA: Diagnosis not present

## 2017-11-22 DIAGNOSIS — G4733 Obstructive sleep apnea (adult) (pediatric): Secondary | ICD-10-CM | POA: Diagnosis not present

## 2017-11-22 DIAGNOSIS — Z9189 Other specified personal risk factors, not elsewhere classified: Secondary | ICD-10-CM | POA: Diagnosis not present

## 2017-11-22 DIAGNOSIS — I10 Essential (primary) hypertension: Secondary | ICD-10-CM | POA: Diagnosis not present

## 2017-12-12 DIAGNOSIS — K080 Exfoliation of teeth due to systemic causes: Secondary | ICD-10-CM | POA: Diagnosis not present

## 2017-12-29 DIAGNOSIS — R05 Cough: Secondary | ICD-10-CM | POA: Diagnosis not present

## 2017-12-29 DIAGNOSIS — J069 Acute upper respiratory infection, unspecified: Secondary | ICD-10-CM | POA: Diagnosis not present

## 2018-01-06 DIAGNOSIS — Z01818 Encounter for other preprocedural examination: Secondary | ICD-10-CM | POA: Diagnosis not present

## 2018-01-06 DIAGNOSIS — Z713 Dietary counseling and surveillance: Secondary | ICD-10-CM | POA: Diagnosis not present

## 2018-01-06 DIAGNOSIS — Z6841 Body Mass Index (BMI) 40.0 and over, adult: Secondary | ICD-10-CM | POA: Diagnosis not present

## 2018-01-14 DIAGNOSIS — E1159 Type 2 diabetes mellitus with other circulatory complications: Secondary | ICD-10-CM | POA: Diagnosis not present

## 2018-01-14 DIAGNOSIS — I10 Essential (primary) hypertension: Secondary | ICD-10-CM | POA: Diagnosis not present

## 2018-01-20 DIAGNOSIS — J029 Acute pharyngitis, unspecified: Secondary | ICD-10-CM | POA: Diagnosis not present

## 2018-01-23 DIAGNOSIS — Z01818 Encounter for other preprocedural examination: Secondary | ICD-10-CM | POA: Diagnosis not present

## 2018-01-23 DIAGNOSIS — K219 Gastro-esophageal reflux disease without esophagitis: Secondary | ICD-10-CM | POA: Diagnosis not present

## 2018-01-23 DIAGNOSIS — Z8249 Family history of ischemic heart disease and other diseases of the circulatory system: Secondary | ICD-10-CM | POA: Diagnosis not present

## 2018-01-23 DIAGNOSIS — Z7982 Long term (current) use of aspirin: Secondary | ICD-10-CM | POA: Diagnosis not present

## 2018-01-23 DIAGNOSIS — G4733 Obstructive sleep apnea (adult) (pediatric): Secondary | ICD-10-CM | POA: Diagnosis not present

## 2018-01-23 DIAGNOSIS — M1990 Unspecified osteoarthritis, unspecified site: Secondary | ICD-10-CM | POA: Diagnosis not present

## 2018-01-23 DIAGNOSIS — I10 Essential (primary) hypertension: Secondary | ICD-10-CM | POA: Diagnosis not present

## 2018-01-23 DIAGNOSIS — E119 Type 2 diabetes mellitus without complications: Secondary | ICD-10-CM | POA: Diagnosis not present

## 2018-01-23 DIAGNOSIS — Z6841 Body Mass Index (BMI) 40.0 and over, adult: Secondary | ICD-10-CM | POA: Diagnosis not present

## 2018-01-23 DIAGNOSIS — K449 Diaphragmatic hernia without obstruction or gangrene: Secondary | ICD-10-CM | POA: Diagnosis not present

## 2018-01-23 DIAGNOSIS — Z7984 Long term (current) use of oral hypoglycemic drugs: Secondary | ICD-10-CM | POA: Diagnosis not present

## 2018-01-23 DIAGNOSIS — Z833 Family history of diabetes mellitus: Secondary | ICD-10-CM | POA: Diagnosis not present

## 2018-01-23 DIAGNOSIS — E785 Hyperlipidemia, unspecified: Secondary | ICD-10-CM | POA: Diagnosis not present

## 2018-01-26 DIAGNOSIS — Z9049 Acquired absence of other specified parts of digestive tract: Secondary | ICD-10-CM | POA: Diagnosis not present

## 2018-01-26 DIAGNOSIS — Z9884 Bariatric surgery status: Secondary | ICD-10-CM | POA: Diagnosis not present

## 2018-01-26 DIAGNOSIS — E119 Type 2 diabetes mellitus without complications: Secondary | ICD-10-CM | POA: Diagnosis not present

## 2018-01-26 DIAGNOSIS — I10 Essential (primary) hypertension: Secondary | ICD-10-CM | POA: Diagnosis not present

## 2018-01-26 DIAGNOSIS — K449 Diaphragmatic hernia without obstruction or gangrene: Secondary | ICD-10-CM | POA: Diagnosis not present

## 2018-01-26 DIAGNOSIS — Z6841 Body Mass Index (BMI) 40.0 and over, adult: Secondary | ICD-10-CM | POA: Diagnosis not present

## 2018-02-25 DIAGNOSIS — Z9884 Bariatric surgery status: Secondary | ICD-10-CM | POA: Diagnosis not present

## 2018-02-25 DIAGNOSIS — Z713 Dietary counseling and surveillance: Secondary | ICD-10-CM | POA: Diagnosis not present

## 2018-04-28 DIAGNOSIS — Z713 Dietary counseling and surveillance: Secondary | ICD-10-CM | POA: Diagnosis not present

## 2018-04-28 DIAGNOSIS — E669 Obesity, unspecified: Secondary | ICD-10-CM | POA: Diagnosis not present

## 2018-04-28 DIAGNOSIS — Z6834 Body mass index (BMI) 34.0-34.9, adult: Secondary | ICD-10-CM | POA: Diagnosis not present

## 2018-04-28 DIAGNOSIS — Z9884 Bariatric surgery status: Secondary | ICD-10-CM | POA: Diagnosis not present

## 2018-05-19 DIAGNOSIS — M21622 Bunionette of left foot: Secondary | ICD-10-CM | POA: Diagnosis not present

## 2018-05-19 DIAGNOSIS — M21612 Bunion of left foot: Secondary | ICD-10-CM | POA: Diagnosis not present

## 2018-05-19 DIAGNOSIS — M21961 Unspecified acquired deformity of right lower leg: Secondary | ICD-10-CM | POA: Diagnosis not present

## 2018-05-19 DIAGNOSIS — M21611 Bunion of right foot: Secondary | ICD-10-CM | POA: Diagnosis not present

## 2018-05-19 DIAGNOSIS — M21621 Bunionette of right foot: Secondary | ICD-10-CM | POA: Diagnosis not present

## 2018-05-19 DIAGNOSIS — M21962 Unspecified acquired deformity of left lower leg: Secondary | ICD-10-CM | POA: Diagnosis not present

## 2018-05-19 DIAGNOSIS — E139 Other specified diabetes mellitus without complications: Secondary | ICD-10-CM | POA: Diagnosis not present

## 2018-06-15 DIAGNOSIS — L718 Other rosacea: Secondary | ICD-10-CM | POA: Diagnosis not present

## 2018-06-15 DIAGNOSIS — D2372 Other benign neoplasm of skin of left lower limb, including hip: Secondary | ICD-10-CM | POA: Diagnosis not present

## 2018-06-15 DIAGNOSIS — D225 Melanocytic nevi of trunk: Secondary | ICD-10-CM | POA: Diagnosis not present

## 2018-06-15 DIAGNOSIS — D2271 Melanocytic nevi of right lower limb, including hip: Secondary | ICD-10-CM | POA: Diagnosis not present

## 2018-06-29 DIAGNOSIS — G4733 Obstructive sleep apnea (adult) (pediatric): Secondary | ICD-10-CM | POA: Diagnosis not present

## 2018-06-29 DIAGNOSIS — I10 Essential (primary) hypertension: Secondary | ICD-10-CM | POA: Diagnosis not present

## 2018-07-13 DIAGNOSIS — F4329 Adjustment disorder with other symptoms: Secondary | ICD-10-CM | POA: Diagnosis not present

## 2018-07-27 DIAGNOSIS — Z9884 Bariatric surgery status: Secondary | ICD-10-CM | POA: Diagnosis not present

## 2018-07-27 DIAGNOSIS — Z713 Dietary counseling and surveillance: Secondary | ICD-10-CM | POA: Diagnosis not present

## 2018-07-30 DIAGNOSIS — G4733 Obstructive sleep apnea (adult) (pediatric): Secondary | ICD-10-CM | POA: Diagnosis not present

## 2018-07-30 DIAGNOSIS — I10 Essential (primary) hypertension: Secondary | ICD-10-CM | POA: Diagnosis not present

## 2018-08-10 DIAGNOSIS — Z1231 Encounter for screening mammogram for malignant neoplasm of breast: Secondary | ICD-10-CM | POA: Diagnosis not present

## 2018-08-11 DIAGNOSIS — E559 Vitamin D deficiency, unspecified: Secondary | ICD-10-CM | POA: Diagnosis not present

## 2018-08-11 DIAGNOSIS — E785 Hyperlipidemia, unspecified: Secondary | ICD-10-CM | POA: Diagnosis not present

## 2018-08-11 DIAGNOSIS — I10 Essential (primary) hypertension: Secondary | ICD-10-CM | POA: Diagnosis not present

## 2018-08-11 DIAGNOSIS — E1159 Type 2 diabetes mellitus with other circulatory complications: Secondary | ICD-10-CM | POA: Diagnosis not present

## 2018-08-21 DIAGNOSIS — G4733 Obstructive sleep apnea (adult) (pediatric): Secondary | ICD-10-CM | POA: Diagnosis not present

## 2018-08-29 DIAGNOSIS — G4733 Obstructive sleep apnea (adult) (pediatric): Secondary | ICD-10-CM | POA: Diagnosis not present

## 2018-08-29 DIAGNOSIS — I10 Essential (primary) hypertension: Secondary | ICD-10-CM | POA: Diagnosis not present

## 2018-09-10 ENCOUNTER — Other Ambulatory Visit (HOSPITAL_COMMUNITY)
Admission: RE | Admit: 2018-09-10 | Discharge: 2018-09-10 | Disposition: A | Discharge status: Home / Self Care | Payer: Federal, State, Local not specified - PPO | Source: Ambulatory Visit | Attending: Family Medicine | Admitting: Family Medicine

## 2018-09-10 ENCOUNTER — Other Ambulatory Visit: Payer: Self-pay | Admitting: Family Medicine

## 2018-09-10 DIAGNOSIS — Z23 Encounter for immunization: Secondary | ICD-10-CM | POA: Diagnosis not present

## 2018-09-10 DIAGNOSIS — Z124 Encounter for screening for malignant neoplasm of cervix: Secondary | ICD-10-CM | POA: Diagnosis not present

## 2018-09-10 DIAGNOSIS — E1159 Type 2 diabetes mellitus with other circulatory complications: Secondary | ICD-10-CM | POA: Diagnosis not present

## 2018-09-10 DIAGNOSIS — E785 Hyperlipidemia, unspecified: Secondary | ICD-10-CM | POA: Diagnosis not present

## 2018-09-10 DIAGNOSIS — N951 Menopausal and female climacteric states: Secondary | ICD-10-CM | POA: Diagnosis not present

## 2018-09-10 DIAGNOSIS — E559 Vitamin D deficiency, unspecified: Secondary | ICD-10-CM | POA: Diagnosis not present

## 2018-09-10 DIAGNOSIS — Z0000 Encounter for general adult medical examination without abnormal findings: Secondary | ICD-10-CM | POA: Diagnosis not present

## 2018-09-10 DIAGNOSIS — Z9884 Bariatric surgery status: Secondary | ICD-10-CM | POA: Diagnosis not present

## 2018-09-17 LAB — CYTOLOGY - PAP
Chlamydia: NEGATIVE
Diagnosis: NEGATIVE
Neisseria Gonorrhea: NEGATIVE

## 2018-09-22 DIAGNOSIS — G4733 Obstructive sleep apnea (adult) (pediatric): Secondary | ICD-10-CM | POA: Diagnosis not present

## 2018-09-22 DIAGNOSIS — Z8659 Personal history of other mental and behavioral disorders: Secondary | ICD-10-CM | POA: Diagnosis not present

## 2018-09-29 DIAGNOSIS — G4733 Obstructive sleep apnea (adult) (pediatric): Secondary | ICD-10-CM | POA: Diagnosis not present

## 2018-10-22 DIAGNOSIS — G4733 Obstructive sleep apnea (adult) (pediatric): Secondary | ICD-10-CM | POA: Diagnosis not present

## 2018-11-30 DIAGNOSIS — F4329 Adjustment disorder with other symptoms: Secondary | ICD-10-CM | POA: Diagnosis not present

## 2018-12-30 DIAGNOSIS — G4733 Obstructive sleep apnea (adult) (pediatric): Secondary | ICD-10-CM | POA: Diagnosis not present

## 2019-01-26 DIAGNOSIS — Z03818 Encounter for observation for suspected exposure to other biological agents ruled out: Secondary | ICD-10-CM | POA: Diagnosis not present

## 2019-01-26 DIAGNOSIS — Z20828 Contact with and (suspected) exposure to other viral communicable diseases: Secondary | ICD-10-CM | POA: Diagnosis not present

## 2019-01-27 DIAGNOSIS — E663 Overweight: Secondary | ICD-10-CM | POA: Diagnosis not present

## 2019-02-23 DIAGNOSIS — I10 Essential (primary) hypertension: Secondary | ICD-10-CM | POA: Diagnosis not present

## 2019-03-23 DIAGNOSIS — J3089 Other allergic rhinitis: Secondary | ICD-10-CM | POA: Diagnosis not present

## 2019-03-23 DIAGNOSIS — I10 Essential (primary) hypertension: Secondary | ICD-10-CM | POA: Diagnosis not present

## 2019-03-23 DIAGNOSIS — E785 Hyperlipidemia, unspecified: Secondary | ICD-10-CM | POA: Diagnosis not present

## 2019-03-23 DIAGNOSIS — E1159 Type 2 diabetes mellitus with other circulatory complications: Secondary | ICD-10-CM | POA: Diagnosis not present

## 2019-03-23 DIAGNOSIS — E559 Vitamin D deficiency, unspecified: Secondary | ICD-10-CM | POA: Diagnosis not present

## 2019-03-29 DIAGNOSIS — G4733 Obstructive sleep apnea (adult) (pediatric): Secondary | ICD-10-CM | POA: Diagnosis not present

## 2019-03-30 DIAGNOSIS — E139 Other specified diabetes mellitus without complications: Secondary | ICD-10-CM | POA: Diagnosis not present

## 2019-03-30 DIAGNOSIS — M21611 Bunion of right foot: Secondary | ICD-10-CM | POA: Diagnosis not present

## 2019-03-30 DIAGNOSIS — M21612 Bunion of left foot: Secondary | ICD-10-CM | POA: Diagnosis not present

## 2019-03-30 DIAGNOSIS — M205X2 Other deformities of toe(s) (acquired), left foot: Secondary | ICD-10-CM | POA: Diagnosis not present

## 2019-03-30 DIAGNOSIS — M21622 Bunionette of left foot: Secondary | ICD-10-CM | POA: Diagnosis not present

## 2019-04-01 DIAGNOSIS — G4733 Obstructive sleep apnea (adult) (pediatric): Secondary | ICD-10-CM | POA: Diagnosis not present

## 2019-04-14 DIAGNOSIS — J301 Allergic rhinitis due to pollen: Secondary | ICD-10-CM | POA: Diagnosis not present

## 2019-04-14 DIAGNOSIS — J3081 Allergic rhinitis due to animal (cat) (dog) hair and dander: Secondary | ICD-10-CM | POA: Diagnosis not present

## 2019-04-14 DIAGNOSIS — J3089 Other allergic rhinitis: Secondary | ICD-10-CM | POA: Diagnosis not present

## 2019-04-14 DIAGNOSIS — H1045 Other chronic allergic conjunctivitis: Secondary | ICD-10-CM | POA: Diagnosis not present

## 2019-04-19 DIAGNOSIS — J301 Allergic rhinitis due to pollen: Secondary | ICD-10-CM | POA: Diagnosis not present

## 2019-04-20 DIAGNOSIS — J3089 Other allergic rhinitis: Secondary | ICD-10-CM | POA: Diagnosis not present

## 2019-04-20 DIAGNOSIS — J3081 Allergic rhinitis due to animal (cat) (dog) hair and dander: Secondary | ICD-10-CM | POA: Diagnosis not present

## 2019-04-27 DIAGNOSIS — M21611 Bunion of right foot: Secondary | ICD-10-CM | POA: Diagnosis not present

## 2019-04-27 DIAGNOSIS — E139 Other specified diabetes mellitus without complications: Secondary | ICD-10-CM | POA: Diagnosis not present

## 2019-04-27 DIAGNOSIS — M21612 Bunion of left foot: Secondary | ICD-10-CM | POA: Diagnosis not present

## 2019-04-27 DIAGNOSIS — M21622 Bunionette of left foot: Secondary | ICD-10-CM | POA: Diagnosis not present

## 2019-05-12 DIAGNOSIS — J3081 Allergic rhinitis due to animal (cat) (dog) hair and dander: Secondary | ICD-10-CM | POA: Diagnosis not present

## 2019-05-12 DIAGNOSIS — J301 Allergic rhinitis due to pollen: Secondary | ICD-10-CM | POA: Diagnosis not present

## 2019-05-12 DIAGNOSIS — J3089 Other allergic rhinitis: Secondary | ICD-10-CM | POA: Diagnosis not present

## 2019-05-17 DIAGNOSIS — J301 Allergic rhinitis due to pollen: Secondary | ICD-10-CM | POA: Diagnosis not present

## 2019-05-17 DIAGNOSIS — J3081 Allergic rhinitis due to animal (cat) (dog) hair and dander: Secondary | ICD-10-CM | POA: Diagnosis not present

## 2019-05-17 DIAGNOSIS — J3089 Other allergic rhinitis: Secondary | ICD-10-CM | POA: Diagnosis not present

## 2019-05-20 DIAGNOSIS — J301 Allergic rhinitis due to pollen: Secondary | ICD-10-CM | POA: Diagnosis not present

## 2019-05-20 DIAGNOSIS — J3081 Allergic rhinitis due to animal (cat) (dog) hair and dander: Secondary | ICD-10-CM | POA: Diagnosis not present

## 2019-05-20 DIAGNOSIS — J3089 Other allergic rhinitis: Secondary | ICD-10-CM | POA: Diagnosis not present

## 2019-05-25 DIAGNOSIS — J301 Allergic rhinitis due to pollen: Secondary | ICD-10-CM | POA: Diagnosis not present

## 2019-05-25 DIAGNOSIS — J3081 Allergic rhinitis due to animal (cat) (dog) hair and dander: Secondary | ICD-10-CM | POA: Diagnosis not present

## 2019-05-25 DIAGNOSIS — J3089 Other allergic rhinitis: Secondary | ICD-10-CM | POA: Diagnosis not present

## 2019-05-30 DIAGNOSIS — I10 Essential (primary) hypertension: Secondary | ICD-10-CM | POA: Diagnosis not present

## 2019-05-30 DIAGNOSIS — G4733 Obstructive sleep apnea (adult) (pediatric): Secondary | ICD-10-CM | POA: Diagnosis not present

## 2019-06-01 DIAGNOSIS — J301 Allergic rhinitis due to pollen: Secondary | ICD-10-CM | POA: Diagnosis not present

## 2019-06-01 DIAGNOSIS — J3089 Other allergic rhinitis: Secondary | ICD-10-CM | POA: Diagnosis not present

## 2019-06-01 DIAGNOSIS — J3081 Allergic rhinitis due to animal (cat) (dog) hair and dander: Secondary | ICD-10-CM | POA: Diagnosis not present

## 2019-06-03 DIAGNOSIS — J301 Allergic rhinitis due to pollen: Secondary | ICD-10-CM | POA: Diagnosis not present

## 2019-06-03 DIAGNOSIS — J3081 Allergic rhinitis due to animal (cat) (dog) hair and dander: Secondary | ICD-10-CM | POA: Diagnosis not present

## 2019-06-03 DIAGNOSIS — J3089 Other allergic rhinitis: Secondary | ICD-10-CM | POA: Diagnosis not present

## 2019-06-09 DIAGNOSIS — J301 Allergic rhinitis due to pollen: Secondary | ICD-10-CM | POA: Diagnosis not present

## 2019-06-09 DIAGNOSIS — J3089 Other allergic rhinitis: Secondary | ICD-10-CM | POA: Diagnosis not present

## 2019-06-09 DIAGNOSIS — J3081 Allergic rhinitis due to animal (cat) (dog) hair and dander: Secondary | ICD-10-CM | POA: Diagnosis not present

## 2019-06-14 DIAGNOSIS — J301 Allergic rhinitis due to pollen: Secondary | ICD-10-CM | POA: Diagnosis not present

## 2019-06-14 DIAGNOSIS — J3081 Allergic rhinitis due to animal (cat) (dog) hair and dander: Secondary | ICD-10-CM | POA: Diagnosis not present

## 2019-06-14 DIAGNOSIS — J3089 Other allergic rhinitis: Secondary | ICD-10-CM | POA: Diagnosis not present

## 2019-06-17 DIAGNOSIS — J301 Allergic rhinitis due to pollen: Secondary | ICD-10-CM | POA: Diagnosis not present

## 2019-06-17 DIAGNOSIS — J3081 Allergic rhinitis due to animal (cat) (dog) hair and dander: Secondary | ICD-10-CM | POA: Diagnosis not present

## 2019-06-17 DIAGNOSIS — J3089 Other allergic rhinitis: Secondary | ICD-10-CM | POA: Diagnosis not present

## 2019-06-23 DIAGNOSIS — J301 Allergic rhinitis due to pollen: Secondary | ICD-10-CM | POA: Diagnosis not present

## 2019-06-23 DIAGNOSIS — J3089 Other allergic rhinitis: Secondary | ICD-10-CM | POA: Diagnosis not present

## 2019-06-23 DIAGNOSIS — J3081 Allergic rhinitis due to animal (cat) (dog) hair and dander: Secondary | ICD-10-CM | POA: Diagnosis not present

## 2019-06-29 DIAGNOSIS — G4733 Obstructive sleep apnea (adult) (pediatric): Secondary | ICD-10-CM | POA: Diagnosis not present

## 2019-06-29 DIAGNOSIS — J3081 Allergic rhinitis due to animal (cat) (dog) hair and dander: Secondary | ICD-10-CM | POA: Diagnosis not present

## 2019-06-29 DIAGNOSIS — J301 Allergic rhinitis due to pollen: Secondary | ICD-10-CM | POA: Diagnosis not present

## 2019-06-29 DIAGNOSIS — J3089 Other allergic rhinitis: Secondary | ICD-10-CM | POA: Diagnosis not present

## 2019-07-01 DIAGNOSIS — J3089 Other allergic rhinitis: Secondary | ICD-10-CM | POA: Diagnosis not present

## 2019-07-01 DIAGNOSIS — J3081 Allergic rhinitis due to animal (cat) (dog) hair and dander: Secondary | ICD-10-CM | POA: Diagnosis not present

## 2019-07-01 DIAGNOSIS — J301 Allergic rhinitis due to pollen: Secondary | ICD-10-CM | POA: Diagnosis not present

## 2019-07-07 DIAGNOSIS — J3089 Other allergic rhinitis: Secondary | ICD-10-CM | POA: Diagnosis not present

## 2019-07-07 DIAGNOSIS — J301 Allergic rhinitis due to pollen: Secondary | ICD-10-CM | POA: Diagnosis not present

## 2019-07-07 DIAGNOSIS — J3081 Allergic rhinitis due to animal (cat) (dog) hair and dander: Secondary | ICD-10-CM | POA: Diagnosis not present

## 2019-07-19 DIAGNOSIS — J3081 Allergic rhinitis due to animal (cat) (dog) hair and dander: Secondary | ICD-10-CM | POA: Diagnosis not present

## 2019-07-19 DIAGNOSIS — J3089 Other allergic rhinitis: Secondary | ICD-10-CM | POA: Diagnosis not present

## 2019-07-19 DIAGNOSIS — J301 Allergic rhinitis due to pollen: Secondary | ICD-10-CM | POA: Diagnosis not present

## 2019-07-19 DIAGNOSIS — R21 Rash and other nonspecific skin eruption: Secondary | ICD-10-CM | POA: Diagnosis not present

## 2019-07-19 DIAGNOSIS — H1045 Other chronic allergic conjunctivitis: Secondary | ICD-10-CM | POA: Diagnosis not present

## 2019-07-22 DIAGNOSIS — J3089 Other allergic rhinitis: Secondary | ICD-10-CM | POA: Diagnosis not present

## 2019-07-22 DIAGNOSIS — J3081 Allergic rhinitis due to animal (cat) (dog) hair and dander: Secondary | ICD-10-CM | POA: Diagnosis not present

## 2019-07-27 DIAGNOSIS — J301 Allergic rhinitis due to pollen: Secondary | ICD-10-CM | POA: Diagnosis not present

## 2019-07-27 DIAGNOSIS — J3089 Other allergic rhinitis: Secondary | ICD-10-CM | POA: Diagnosis not present

## 2019-07-27 DIAGNOSIS — J3081 Allergic rhinitis due to animal (cat) (dog) hair and dander: Secondary | ICD-10-CM | POA: Diagnosis not present

## 2019-07-29 DIAGNOSIS — J3081 Allergic rhinitis due to animal (cat) (dog) hair and dander: Secondary | ICD-10-CM | POA: Diagnosis not present

## 2019-07-29 DIAGNOSIS — J3089 Other allergic rhinitis: Secondary | ICD-10-CM | POA: Diagnosis not present

## 2019-07-29 DIAGNOSIS — J301 Allergic rhinitis due to pollen: Secondary | ICD-10-CM | POA: Diagnosis not present

## 2019-08-04 DIAGNOSIS — J301 Allergic rhinitis due to pollen: Secondary | ICD-10-CM | POA: Diagnosis not present

## 2019-08-04 DIAGNOSIS — J3081 Allergic rhinitis due to animal (cat) (dog) hair and dander: Secondary | ICD-10-CM | POA: Diagnosis not present

## 2019-08-04 DIAGNOSIS — J3089 Other allergic rhinitis: Secondary | ICD-10-CM | POA: Diagnosis not present

## 2019-08-11 DIAGNOSIS — Z1231 Encounter for screening mammogram for malignant neoplasm of breast: Secondary | ICD-10-CM | POA: Diagnosis not present

## 2019-08-17 DIAGNOSIS — M21612 Bunion of left foot: Secondary | ICD-10-CM | POA: Diagnosis not present

## 2019-08-17 DIAGNOSIS — M21622 Bunionette of left foot: Secondary | ICD-10-CM | POA: Diagnosis not present

## 2019-08-17 DIAGNOSIS — E139 Other specified diabetes mellitus without complications: Secondary | ICD-10-CM | POA: Diagnosis not present

## 2019-08-17 DIAGNOSIS — M21611 Bunion of right foot: Secondary | ICD-10-CM | POA: Diagnosis not present

## 2019-08-18 DIAGNOSIS — D2372 Other benign neoplasm of skin of left lower limb, including hip: Secondary | ICD-10-CM | POA: Diagnosis not present

## 2019-08-18 DIAGNOSIS — J3081 Allergic rhinitis due to animal (cat) (dog) hair and dander: Secondary | ICD-10-CM | POA: Diagnosis not present

## 2019-08-18 DIAGNOSIS — L298 Other pruritus: Secondary | ICD-10-CM | POA: Diagnosis not present

## 2019-08-18 DIAGNOSIS — J3089 Other allergic rhinitis: Secondary | ICD-10-CM | POA: Diagnosis not present

## 2019-08-18 DIAGNOSIS — D2271 Melanocytic nevi of right lower limb, including hip: Secondary | ICD-10-CM | POA: Diagnosis not present

## 2019-08-18 DIAGNOSIS — J301 Allergic rhinitis due to pollen: Secondary | ICD-10-CM | POA: Diagnosis not present

## 2019-08-18 DIAGNOSIS — L239 Allergic contact dermatitis, unspecified cause: Secondary | ICD-10-CM | POA: Diagnosis not present

## 2019-08-25 DIAGNOSIS — J3089 Other allergic rhinitis: Secondary | ICD-10-CM | POA: Diagnosis not present

## 2019-08-25 DIAGNOSIS — J3081 Allergic rhinitis due to animal (cat) (dog) hair and dander: Secondary | ICD-10-CM | POA: Diagnosis not present

## 2019-08-25 DIAGNOSIS — J301 Allergic rhinitis due to pollen: Secondary | ICD-10-CM | POA: Diagnosis not present

## 2019-09-01 DIAGNOSIS — J3089 Other allergic rhinitis: Secondary | ICD-10-CM | POA: Diagnosis not present

## 2019-09-01 DIAGNOSIS — J301 Allergic rhinitis due to pollen: Secondary | ICD-10-CM | POA: Diagnosis not present

## 2019-09-01 DIAGNOSIS — J3081 Allergic rhinitis due to animal (cat) (dog) hair and dander: Secondary | ICD-10-CM | POA: Diagnosis not present

## 2019-09-08 DIAGNOSIS — J3089 Other allergic rhinitis: Secondary | ICD-10-CM | POA: Diagnosis not present

## 2019-09-08 DIAGNOSIS — J3081 Allergic rhinitis due to animal (cat) (dog) hair and dander: Secondary | ICD-10-CM | POA: Diagnosis not present

## 2019-09-08 DIAGNOSIS — E785 Hyperlipidemia, unspecified: Secondary | ICD-10-CM | POA: Diagnosis not present

## 2019-09-08 DIAGNOSIS — I10 Essential (primary) hypertension: Secondary | ICD-10-CM | POA: Diagnosis not present

## 2019-09-08 DIAGNOSIS — J301 Allergic rhinitis due to pollen: Secondary | ICD-10-CM | POA: Diagnosis not present

## 2019-09-08 DIAGNOSIS — E1159 Type 2 diabetes mellitus with other circulatory complications: Secondary | ICD-10-CM | POA: Diagnosis not present

## 2019-09-08 DIAGNOSIS — E559 Vitamin D deficiency, unspecified: Secondary | ICD-10-CM | POA: Diagnosis not present

## 2019-09-14 DIAGNOSIS — J3089 Other allergic rhinitis: Secondary | ICD-10-CM | POA: Diagnosis not present

## 2019-09-14 DIAGNOSIS — E559 Vitamin D deficiency, unspecified: Secondary | ICD-10-CM | POA: Diagnosis not present

## 2019-09-14 DIAGNOSIS — Z30432 Encounter for removal of intrauterine contraceptive device: Secondary | ICD-10-CM | POA: Diagnosis not present

## 2019-09-14 DIAGNOSIS — I10 Essential (primary) hypertension: Secondary | ICD-10-CM | POA: Diagnosis not present

## 2019-09-14 DIAGNOSIS — J3081 Allergic rhinitis due to animal (cat) (dog) hair and dander: Secondary | ICD-10-CM | POA: Diagnosis not present

## 2019-09-14 DIAGNOSIS — Z0000 Encounter for general adult medical examination without abnormal findings: Secondary | ICD-10-CM | POA: Diagnosis not present

## 2019-09-14 DIAGNOSIS — E785 Hyperlipidemia, unspecified: Secondary | ICD-10-CM | POA: Diagnosis not present

## 2019-09-14 DIAGNOSIS — E1159 Type 2 diabetes mellitus with other circulatory complications: Secondary | ICD-10-CM | POA: Diagnosis not present

## 2019-09-14 DIAGNOSIS — J301 Allergic rhinitis due to pollen: Secondary | ICD-10-CM | POA: Diagnosis not present

## 2019-09-20 DIAGNOSIS — J3089 Other allergic rhinitis: Secondary | ICD-10-CM | POA: Diagnosis not present

## 2019-09-20 DIAGNOSIS — J3081 Allergic rhinitis due to animal (cat) (dog) hair and dander: Secondary | ICD-10-CM | POA: Diagnosis not present

## 2019-09-20 DIAGNOSIS — Z9884 Bariatric surgery status: Secondary | ICD-10-CM | POA: Diagnosis not present

## 2019-09-20 DIAGNOSIS — Z6830 Body mass index (BMI) 30.0-30.9, adult: Secondary | ICD-10-CM | POA: Diagnosis not present

## 2019-09-20 DIAGNOSIS — Z713 Dietary counseling and surveillance: Secondary | ICD-10-CM | POA: Diagnosis not present

## 2019-09-20 DIAGNOSIS — J301 Allergic rhinitis due to pollen: Secondary | ICD-10-CM | POA: Diagnosis not present

## 2019-09-20 DIAGNOSIS — R635 Abnormal weight gain: Secondary | ICD-10-CM | POA: Diagnosis not present

## 2019-09-23 DIAGNOSIS — J3089 Other allergic rhinitis: Secondary | ICD-10-CM | POA: Diagnosis not present

## 2019-09-23 DIAGNOSIS — J301 Allergic rhinitis due to pollen: Secondary | ICD-10-CM | POA: Diagnosis not present

## 2019-09-23 DIAGNOSIS — J3081 Allergic rhinitis due to animal (cat) (dog) hair and dander: Secondary | ICD-10-CM | POA: Diagnosis not present

## 2019-09-27 DIAGNOSIS — J3089 Other allergic rhinitis: Secondary | ICD-10-CM | POA: Diagnosis not present

## 2019-09-27 DIAGNOSIS — J3081 Allergic rhinitis due to animal (cat) (dog) hair and dander: Secondary | ICD-10-CM | POA: Diagnosis not present

## 2019-09-27 DIAGNOSIS — I10 Essential (primary) hypertension: Secondary | ICD-10-CM | POA: Diagnosis not present

## 2019-09-27 DIAGNOSIS — J301 Allergic rhinitis due to pollen: Secondary | ICD-10-CM | POA: Diagnosis not present

## 2019-09-27 DIAGNOSIS — G4733 Obstructive sleep apnea (adult) (pediatric): Secondary | ICD-10-CM | POA: Diagnosis not present

## 2019-09-29 DIAGNOSIS — J3089 Other allergic rhinitis: Secondary | ICD-10-CM | POA: Diagnosis not present

## 2019-09-29 DIAGNOSIS — J301 Allergic rhinitis due to pollen: Secondary | ICD-10-CM | POA: Diagnosis not present

## 2019-09-29 DIAGNOSIS — J3081 Allergic rhinitis due to animal (cat) (dog) hair and dander: Secondary | ICD-10-CM | POA: Diagnosis not present

## 2019-09-29 DIAGNOSIS — G4733 Obstructive sleep apnea (adult) (pediatric): Secondary | ICD-10-CM | POA: Diagnosis not present

## 2019-10-05 DIAGNOSIS — J301 Allergic rhinitis due to pollen: Secondary | ICD-10-CM | POA: Diagnosis not present

## 2019-10-06 DIAGNOSIS — J3089 Other allergic rhinitis: Secondary | ICD-10-CM | POA: Diagnosis not present

## 2019-10-06 DIAGNOSIS — J301 Allergic rhinitis due to pollen: Secondary | ICD-10-CM | POA: Diagnosis not present

## 2019-10-06 DIAGNOSIS — J3081 Allergic rhinitis due to animal (cat) (dog) hair and dander: Secondary | ICD-10-CM | POA: Diagnosis not present

## 2019-10-13 DIAGNOSIS — J3081 Allergic rhinitis due to animal (cat) (dog) hair and dander: Secondary | ICD-10-CM | POA: Diagnosis not present

## 2019-10-13 DIAGNOSIS — J301 Allergic rhinitis due to pollen: Secondary | ICD-10-CM | POA: Diagnosis not present

## 2019-10-13 DIAGNOSIS — J3089 Other allergic rhinitis: Secondary | ICD-10-CM | POA: Diagnosis not present

## 2019-10-20 DIAGNOSIS — J3089 Other allergic rhinitis: Secondary | ICD-10-CM | POA: Diagnosis not present

## 2019-10-20 DIAGNOSIS — J3081 Allergic rhinitis due to animal (cat) (dog) hair and dander: Secondary | ICD-10-CM | POA: Diagnosis not present

## 2019-10-20 DIAGNOSIS — J301 Allergic rhinitis due to pollen: Secondary | ICD-10-CM | POA: Diagnosis not present

## 2019-10-27 DIAGNOSIS — J3081 Allergic rhinitis due to animal (cat) (dog) hair and dander: Secondary | ICD-10-CM | POA: Diagnosis not present

## 2019-10-27 DIAGNOSIS — J3089 Other allergic rhinitis: Secondary | ICD-10-CM | POA: Diagnosis not present

## 2019-10-27 DIAGNOSIS — J301 Allergic rhinitis due to pollen: Secondary | ICD-10-CM | POA: Diagnosis not present

## 2019-11-03 DIAGNOSIS — J3089 Other allergic rhinitis: Secondary | ICD-10-CM | POA: Diagnosis not present

## 2019-11-03 DIAGNOSIS — J3081 Allergic rhinitis due to animal (cat) (dog) hair and dander: Secondary | ICD-10-CM | POA: Diagnosis not present

## 2019-11-03 DIAGNOSIS — J301 Allergic rhinitis due to pollen: Secondary | ICD-10-CM | POA: Diagnosis not present

## 2019-11-05 DIAGNOSIS — J3081 Allergic rhinitis due to animal (cat) (dog) hair and dander: Secondary | ICD-10-CM | POA: Diagnosis not present

## 2019-11-05 DIAGNOSIS — J3089 Other allergic rhinitis: Secondary | ICD-10-CM | POA: Diagnosis not present

## 2019-11-10 DIAGNOSIS — J301 Allergic rhinitis due to pollen: Secondary | ICD-10-CM | POA: Diagnosis not present

## 2019-11-10 DIAGNOSIS — J3089 Other allergic rhinitis: Secondary | ICD-10-CM | POA: Diagnosis not present

## 2019-11-10 DIAGNOSIS — J3081 Allergic rhinitis due to animal (cat) (dog) hair and dander: Secondary | ICD-10-CM | POA: Diagnosis not present

## 2019-11-16 DIAGNOSIS — Z79899 Other long term (current) drug therapy: Secondary | ICD-10-CM | POA: Diagnosis not present

## 2019-11-16 DIAGNOSIS — Z9884 Bariatric surgery status: Secondary | ICD-10-CM | POA: Diagnosis not present

## 2019-11-16 DIAGNOSIS — Z713 Dietary counseling and surveillance: Secondary | ICD-10-CM | POA: Diagnosis not present

## 2019-11-17 DIAGNOSIS — J3089 Other allergic rhinitis: Secondary | ICD-10-CM | POA: Diagnosis not present

## 2019-11-17 DIAGNOSIS — J301 Allergic rhinitis due to pollen: Secondary | ICD-10-CM | POA: Diagnosis not present

## 2019-11-17 DIAGNOSIS — J3081 Allergic rhinitis due to animal (cat) (dog) hair and dander: Secondary | ICD-10-CM | POA: Diagnosis not present

## 2019-11-24 DIAGNOSIS — J301 Allergic rhinitis due to pollen: Secondary | ICD-10-CM | POA: Diagnosis not present

## 2019-11-24 DIAGNOSIS — J3081 Allergic rhinitis due to animal (cat) (dog) hair and dander: Secondary | ICD-10-CM | POA: Diagnosis not present

## 2019-11-24 DIAGNOSIS — J3089 Other allergic rhinitis: Secondary | ICD-10-CM | POA: Diagnosis not present

## 2019-12-01 DIAGNOSIS — J3081 Allergic rhinitis due to animal (cat) (dog) hair and dander: Secondary | ICD-10-CM | POA: Diagnosis not present

## 2019-12-01 DIAGNOSIS — J301 Allergic rhinitis due to pollen: Secondary | ICD-10-CM | POA: Diagnosis not present

## 2019-12-01 DIAGNOSIS — J3089 Other allergic rhinitis: Secondary | ICD-10-CM | POA: Diagnosis not present

## 2019-12-08 DIAGNOSIS — J3089 Other allergic rhinitis: Secondary | ICD-10-CM | POA: Diagnosis not present

## 2019-12-08 DIAGNOSIS — J3081 Allergic rhinitis due to animal (cat) (dog) hair and dander: Secondary | ICD-10-CM | POA: Diagnosis not present

## 2019-12-08 DIAGNOSIS — J301 Allergic rhinitis due to pollen: Secondary | ICD-10-CM | POA: Diagnosis not present

## 2019-12-15 DIAGNOSIS — J3089 Other allergic rhinitis: Secondary | ICD-10-CM | POA: Diagnosis not present

## 2019-12-15 DIAGNOSIS — J301 Allergic rhinitis due to pollen: Secondary | ICD-10-CM | POA: Diagnosis not present

## 2019-12-15 DIAGNOSIS — J3081 Allergic rhinitis due to animal (cat) (dog) hair and dander: Secondary | ICD-10-CM | POA: Diagnosis not present

## 2019-12-22 DIAGNOSIS — J3089 Other allergic rhinitis: Secondary | ICD-10-CM | POA: Diagnosis not present

## 2019-12-22 DIAGNOSIS — J301 Allergic rhinitis due to pollen: Secondary | ICD-10-CM | POA: Diagnosis not present

## 2019-12-22 DIAGNOSIS — J3081 Allergic rhinitis due to animal (cat) (dog) hair and dander: Secondary | ICD-10-CM | POA: Diagnosis not present

## 2019-12-29 DIAGNOSIS — J3081 Allergic rhinitis due to animal (cat) (dog) hair and dander: Secondary | ICD-10-CM | POA: Diagnosis not present

## 2019-12-29 DIAGNOSIS — J301 Allergic rhinitis due to pollen: Secondary | ICD-10-CM | POA: Diagnosis not present

## 2019-12-29 DIAGNOSIS — J3089 Other allergic rhinitis: Secondary | ICD-10-CM | POA: Diagnosis not present

## 2019-12-30 DIAGNOSIS — G4733 Obstructive sleep apnea (adult) (pediatric): Secondary | ICD-10-CM | POA: Diagnosis not present

## 2020-01-12 DIAGNOSIS — J301 Allergic rhinitis due to pollen: Secondary | ICD-10-CM | POA: Diagnosis not present

## 2020-01-12 DIAGNOSIS — J3089 Other allergic rhinitis: Secondary | ICD-10-CM | POA: Diagnosis not present

## 2020-01-12 DIAGNOSIS — J3081 Allergic rhinitis due to animal (cat) (dog) hair and dander: Secondary | ICD-10-CM | POA: Diagnosis not present

## 2020-01-19 DIAGNOSIS — J3089 Other allergic rhinitis: Secondary | ICD-10-CM | POA: Diagnosis not present

## 2020-01-19 DIAGNOSIS — J301 Allergic rhinitis due to pollen: Secondary | ICD-10-CM | POA: Diagnosis not present

## 2020-01-19 DIAGNOSIS — J3081 Allergic rhinitis due to animal (cat) (dog) hair and dander: Secondary | ICD-10-CM | POA: Diagnosis not present

## 2020-01-26 DIAGNOSIS — J301 Allergic rhinitis due to pollen: Secondary | ICD-10-CM | POA: Diagnosis not present

## 2020-01-26 DIAGNOSIS — J3089 Other allergic rhinitis: Secondary | ICD-10-CM | POA: Diagnosis not present

## 2020-01-26 DIAGNOSIS — I10 Essential (primary) hypertension: Secondary | ICD-10-CM | POA: Diagnosis not present

## 2020-01-26 DIAGNOSIS — J3081 Allergic rhinitis due to animal (cat) (dog) hair and dander: Secondary | ICD-10-CM | POA: Diagnosis not present

## 2020-01-26 DIAGNOSIS — E119 Type 2 diabetes mellitus without complications: Secondary | ICD-10-CM | POA: Diagnosis not present

## 2020-01-26 DIAGNOSIS — G4733 Obstructive sleep apnea (adult) (pediatric): Secondary | ICD-10-CM | POA: Diagnosis not present

## 2020-02-02 DIAGNOSIS — J3089 Other allergic rhinitis: Secondary | ICD-10-CM | POA: Diagnosis not present

## 2020-02-02 DIAGNOSIS — J3081 Allergic rhinitis due to animal (cat) (dog) hair and dander: Secondary | ICD-10-CM | POA: Diagnosis not present

## 2020-02-02 DIAGNOSIS — J301 Allergic rhinitis due to pollen: Secondary | ICD-10-CM | POA: Diagnosis not present

## 2020-02-09 DIAGNOSIS — J3089 Other allergic rhinitis: Secondary | ICD-10-CM | POA: Diagnosis not present

## 2020-02-09 DIAGNOSIS — J301 Allergic rhinitis due to pollen: Secondary | ICD-10-CM | POA: Diagnosis not present

## 2020-02-09 DIAGNOSIS — J3081 Allergic rhinitis due to animal (cat) (dog) hair and dander: Secondary | ICD-10-CM | POA: Diagnosis not present

## 2020-02-16 DIAGNOSIS — J3081 Allergic rhinitis due to animal (cat) (dog) hair and dander: Secondary | ICD-10-CM | POA: Diagnosis not present

## 2020-02-16 DIAGNOSIS — J3089 Other allergic rhinitis: Secondary | ICD-10-CM | POA: Diagnosis not present

## 2020-02-16 DIAGNOSIS — J301 Allergic rhinitis due to pollen: Secondary | ICD-10-CM | POA: Diagnosis not present

## 2020-02-23 DIAGNOSIS — J301 Allergic rhinitis due to pollen: Secondary | ICD-10-CM | POA: Diagnosis not present

## 2020-02-23 DIAGNOSIS — J3089 Other allergic rhinitis: Secondary | ICD-10-CM | POA: Diagnosis not present

## 2020-02-23 DIAGNOSIS — J3081 Allergic rhinitis due to animal (cat) (dog) hair and dander: Secondary | ICD-10-CM | POA: Diagnosis not present

## 2020-03-01 DIAGNOSIS — J301 Allergic rhinitis due to pollen: Secondary | ICD-10-CM | POA: Diagnosis not present

## 2020-03-01 DIAGNOSIS — J3081 Allergic rhinitis due to animal (cat) (dog) hair and dander: Secondary | ICD-10-CM | POA: Diagnosis not present

## 2020-03-01 DIAGNOSIS — J3089 Other allergic rhinitis: Secondary | ICD-10-CM | POA: Diagnosis not present

## 2020-03-07 DIAGNOSIS — Z713 Dietary counseling and surveillance: Secondary | ICD-10-CM | POA: Diagnosis not present

## 2020-03-07 DIAGNOSIS — Z9884 Bariatric surgery status: Secondary | ICD-10-CM | POA: Diagnosis not present

## 2020-03-07 DIAGNOSIS — E663 Overweight: Secondary | ICD-10-CM | POA: Diagnosis not present

## 2020-03-08 DIAGNOSIS — J3081 Allergic rhinitis due to animal (cat) (dog) hair and dander: Secondary | ICD-10-CM | POA: Diagnosis not present

## 2020-03-08 DIAGNOSIS — J3089 Other allergic rhinitis: Secondary | ICD-10-CM | POA: Diagnosis not present

## 2020-03-08 DIAGNOSIS — J301 Allergic rhinitis due to pollen: Secondary | ICD-10-CM | POA: Diagnosis not present

## 2020-03-13 DIAGNOSIS — J301 Allergic rhinitis due to pollen: Secondary | ICD-10-CM | POA: Diagnosis not present

## 2020-03-14 DIAGNOSIS — Z9884 Bariatric surgery status: Secondary | ICD-10-CM | POA: Diagnosis not present

## 2020-03-15 DIAGNOSIS — H1045 Other chronic allergic conjunctivitis: Secondary | ICD-10-CM | POA: Diagnosis not present

## 2020-03-15 DIAGNOSIS — J3081 Allergic rhinitis due to animal (cat) (dog) hair and dander: Secondary | ICD-10-CM | POA: Diagnosis not present

## 2020-03-15 DIAGNOSIS — J3089 Other allergic rhinitis: Secondary | ICD-10-CM | POA: Diagnosis not present

## 2020-03-15 DIAGNOSIS — J301 Allergic rhinitis due to pollen: Secondary | ICD-10-CM | POA: Diagnosis not present

## 2020-03-20 DIAGNOSIS — E559 Vitamin D deficiency, unspecified: Secondary | ICD-10-CM | POA: Diagnosis not present

## 2020-03-20 DIAGNOSIS — E785 Hyperlipidemia, unspecified: Secondary | ICD-10-CM | POA: Diagnosis not present

## 2020-03-20 DIAGNOSIS — E1159 Type 2 diabetes mellitus with other circulatory complications: Secondary | ICD-10-CM | POA: Diagnosis not present

## 2020-03-22 DIAGNOSIS — J3089 Other allergic rhinitis: Secondary | ICD-10-CM | POA: Diagnosis not present

## 2020-03-22 DIAGNOSIS — J301 Allergic rhinitis due to pollen: Secondary | ICD-10-CM | POA: Diagnosis not present

## 2020-03-22 DIAGNOSIS — J3081 Allergic rhinitis due to animal (cat) (dog) hair and dander: Secondary | ICD-10-CM | POA: Diagnosis not present

## 2020-03-23 DIAGNOSIS — E1159 Type 2 diabetes mellitus with other circulatory complications: Secondary | ICD-10-CM | POA: Diagnosis not present

## 2020-03-23 DIAGNOSIS — E785 Hyperlipidemia, unspecified: Secondary | ICD-10-CM | POA: Diagnosis not present

## 2020-03-23 DIAGNOSIS — I10 Essential (primary) hypertension: Secondary | ICD-10-CM | POA: Diagnosis not present

## 2020-03-23 DIAGNOSIS — E559 Vitamin D deficiency, unspecified: Secondary | ICD-10-CM | POA: Diagnosis not present

## 2020-03-28 DIAGNOSIS — R197 Diarrhea, unspecified: Secondary | ICD-10-CM | POA: Diagnosis not present

## 2020-03-29 DIAGNOSIS — J301 Allergic rhinitis due to pollen: Secondary | ICD-10-CM | POA: Diagnosis not present

## 2020-03-29 DIAGNOSIS — J3089 Other allergic rhinitis: Secondary | ICD-10-CM | POA: Diagnosis not present

## 2020-03-29 DIAGNOSIS — J3081 Allergic rhinitis due to animal (cat) (dog) hair and dander: Secondary | ICD-10-CM | POA: Diagnosis not present

## 2020-03-31 DIAGNOSIS — G4733 Obstructive sleep apnea (adult) (pediatric): Secondary | ICD-10-CM | POA: Diagnosis not present

## 2020-04-03 DIAGNOSIS — G4733 Obstructive sleep apnea (adult) (pediatric): Secondary | ICD-10-CM | POA: Diagnosis not present

## 2020-04-04 DIAGNOSIS — K644 Residual hemorrhoidal skin tags: Secondary | ICD-10-CM | POA: Diagnosis not present

## 2020-04-04 DIAGNOSIS — R197 Diarrhea, unspecified: Secondary | ICD-10-CM | POA: Diagnosis not present

## 2020-04-04 DIAGNOSIS — K625 Hemorrhage of anus and rectum: Secondary | ICD-10-CM | POA: Diagnosis not present

## 2020-04-05 DIAGNOSIS — J3089 Other allergic rhinitis: Secondary | ICD-10-CM | POA: Diagnosis not present

## 2020-04-05 DIAGNOSIS — J3081 Allergic rhinitis due to animal (cat) (dog) hair and dander: Secondary | ICD-10-CM | POA: Diagnosis not present

## 2020-04-05 DIAGNOSIS — J301 Allergic rhinitis due to pollen: Secondary | ICD-10-CM | POA: Diagnosis not present

## 2020-04-12 DIAGNOSIS — J301 Allergic rhinitis due to pollen: Secondary | ICD-10-CM | POA: Diagnosis not present

## 2020-04-12 DIAGNOSIS — J3089 Other allergic rhinitis: Secondary | ICD-10-CM | POA: Diagnosis not present

## 2020-04-12 DIAGNOSIS — J3081 Allergic rhinitis due to animal (cat) (dog) hair and dander: Secondary | ICD-10-CM | POA: Diagnosis not present

## 2020-04-19 DIAGNOSIS — J3081 Allergic rhinitis due to animal (cat) (dog) hair and dander: Secondary | ICD-10-CM | POA: Diagnosis not present

## 2020-04-19 DIAGNOSIS — J301 Allergic rhinitis due to pollen: Secondary | ICD-10-CM | POA: Diagnosis not present

## 2020-04-19 DIAGNOSIS — J3089 Other allergic rhinitis: Secondary | ICD-10-CM | POA: Diagnosis not present

## 2020-04-26 DIAGNOSIS — J3081 Allergic rhinitis due to animal (cat) (dog) hair and dander: Secondary | ICD-10-CM | POA: Diagnosis not present

## 2020-04-26 DIAGNOSIS — J3089 Other allergic rhinitis: Secondary | ICD-10-CM | POA: Diagnosis not present

## 2020-04-26 DIAGNOSIS — J301 Allergic rhinitis due to pollen: Secondary | ICD-10-CM | POA: Diagnosis not present

## 2020-04-27 DIAGNOSIS — J3081 Allergic rhinitis due to animal (cat) (dog) hair and dander: Secondary | ICD-10-CM | POA: Diagnosis not present

## 2020-04-27 DIAGNOSIS — J3089 Other allergic rhinitis: Secondary | ICD-10-CM | POA: Diagnosis not present

## 2020-05-02 DIAGNOSIS — Z01812 Encounter for preprocedural laboratory examination: Secondary | ICD-10-CM | POA: Diagnosis not present

## 2020-05-04 DIAGNOSIS — Z9884 Bariatric surgery status: Secondary | ICD-10-CM | POA: Diagnosis not present

## 2020-05-04 DIAGNOSIS — K900 Celiac disease: Secondary | ICD-10-CM | POA: Diagnosis not present

## 2020-05-04 DIAGNOSIS — K449 Diaphragmatic hernia without obstruction or gangrene: Secondary | ICD-10-CM | POA: Diagnosis not present

## 2020-05-04 DIAGNOSIS — K529 Noninfective gastroenteritis and colitis, unspecified: Secondary | ICD-10-CM | POA: Diagnosis not present

## 2020-05-05 DIAGNOSIS — J3089 Other allergic rhinitis: Secondary | ICD-10-CM | POA: Diagnosis not present

## 2020-05-05 DIAGNOSIS — J3081 Allergic rhinitis due to animal (cat) (dog) hair and dander: Secondary | ICD-10-CM | POA: Diagnosis not present

## 2020-05-05 DIAGNOSIS — J301 Allergic rhinitis due to pollen: Secondary | ICD-10-CM | POA: Diagnosis not present

## 2020-05-10 DIAGNOSIS — J3089 Other allergic rhinitis: Secondary | ICD-10-CM | POA: Diagnosis not present

## 2020-05-10 DIAGNOSIS — J301 Allergic rhinitis due to pollen: Secondary | ICD-10-CM | POA: Diagnosis not present

## 2020-05-10 DIAGNOSIS — J3081 Allergic rhinitis due to animal (cat) (dog) hair and dander: Secondary | ICD-10-CM | POA: Diagnosis not present

## 2020-05-17 DIAGNOSIS — J3081 Allergic rhinitis due to animal (cat) (dog) hair and dander: Secondary | ICD-10-CM | POA: Diagnosis not present

## 2020-05-17 DIAGNOSIS — J3089 Other allergic rhinitis: Secondary | ICD-10-CM | POA: Diagnosis not present

## 2020-05-17 DIAGNOSIS — J301 Allergic rhinitis due to pollen: Secondary | ICD-10-CM | POA: Diagnosis not present

## 2020-05-24 DIAGNOSIS — E139 Other specified diabetes mellitus without complications: Secondary | ICD-10-CM | POA: Diagnosis not present

## 2020-05-24 DIAGNOSIS — M21612 Bunion of left foot: Secondary | ICD-10-CM | POA: Diagnosis not present

## 2020-05-24 DIAGNOSIS — M21622 Bunionette of left foot: Secondary | ICD-10-CM | POA: Diagnosis not present

## 2020-05-24 DIAGNOSIS — M21611 Bunion of right foot: Secondary | ICD-10-CM | POA: Diagnosis not present

## 2020-05-31 DIAGNOSIS — J301 Allergic rhinitis due to pollen: Secondary | ICD-10-CM | POA: Diagnosis not present

## 2020-05-31 DIAGNOSIS — J3089 Other allergic rhinitis: Secondary | ICD-10-CM | POA: Diagnosis not present

## 2020-05-31 DIAGNOSIS — J3081 Allergic rhinitis due to animal (cat) (dog) hair and dander: Secondary | ICD-10-CM | POA: Diagnosis not present

## 2020-06-06 DIAGNOSIS — K900 Celiac disease: Secondary | ICD-10-CM | POA: Diagnosis not present

## 2020-06-07 DIAGNOSIS — J3089 Other allergic rhinitis: Secondary | ICD-10-CM | POA: Diagnosis not present

## 2020-06-07 DIAGNOSIS — J3081 Allergic rhinitis due to animal (cat) (dog) hair and dander: Secondary | ICD-10-CM | POA: Diagnosis not present

## 2020-06-07 DIAGNOSIS — J301 Allergic rhinitis due to pollen: Secondary | ICD-10-CM | POA: Diagnosis not present

## 2020-06-13 DIAGNOSIS — K900 Celiac disease: Secondary | ICD-10-CM | POA: Diagnosis not present

## 2020-06-13 DIAGNOSIS — E663 Overweight: Secondary | ICD-10-CM | POA: Diagnosis not present

## 2020-06-13 DIAGNOSIS — Z9884 Bariatric surgery status: Secondary | ICD-10-CM | POA: Diagnosis not present

## 2020-06-14 DIAGNOSIS — J301 Allergic rhinitis due to pollen: Secondary | ICD-10-CM | POA: Diagnosis not present

## 2020-06-14 DIAGNOSIS — J3081 Allergic rhinitis due to animal (cat) (dog) hair and dander: Secondary | ICD-10-CM | POA: Diagnosis not present

## 2020-06-14 DIAGNOSIS — J3089 Other allergic rhinitis: Secondary | ICD-10-CM | POA: Diagnosis not present

## 2020-06-19 ENCOUNTER — Encounter: Payer: Self-pay | Admitting: Registered"

## 2020-06-19 ENCOUNTER — Encounter: Payer: Federal, State, Local not specified - PPO | Attending: Gastroenterology | Admitting: Registered"

## 2020-06-19 ENCOUNTER — Other Ambulatory Visit: Payer: Self-pay

## 2020-06-19 DIAGNOSIS — K900 Celiac disease: Secondary | ICD-10-CM | POA: Diagnosis not present

## 2020-06-19 NOTE — Patient Instructions (Addendum)
Try 1:1 GF flour in your recipes Continue reading labels for "Contains: Wheat" Consider checking your medications and supplements Consider trying other GF grains Continue eating a balanced diet Consider checking out the [employer] website http://rios.biz/

## 2020-06-19 NOTE — Progress Notes (Signed)
Medical Nutrition Therapy:  Appt start time: 1130 end time:  1230.   Assessment:  Primary concerns today: celiac disease.   Pt states she had not had any sx of CD before January. Pt states Jan 2022 started having frequent diarrhea. Pt states she has started following a gluten-free diet as much as she understood how to. Pt states symptoms of improved.  Patient states she lives alone and so it is not a problem keeping foods out of the house that contain gluten  Preferred Learning Style:   No preference indicated   Learning Readiness:   Change in progress  MEDICAL HISTORY: OSA tried CPAP 2 yrs abo MEDICATIONS: reviewed.  Metformin discontinued Feb and glimepiride discontinued ~Nov 2021.   DIETARY INTAKE:  Everyday foods include fruit.  Avoided foods include gluten.    24-hr recall:  B ( AM): bacon, eggs, grits maybe  Snk ( AM): fruit  L ( PM): left overs Snk ( PM): none D ( PM): spaghetti (GF), tomato based sauce, vegetables, cheese Snk ( PM): ice cream vanilla or chocolate Beverages: water, occasional juice. No alcohol   Usual physical activity: not assessed  Estimated energy needs: ~2000 calories  Progress Towards Goal(s):  In progress.   Nutritional Diagnosis:  NB-1.1 Food and nutrition-related knowledge deficit As related to gluten-free diet for Celiac.  As evidenced by new knowledge gained during visit per patient.    Intervention:  Nutrition Education topics: Pathophysiology of celiac disease Sources of gluten. Cross contamination  Teaching Method Utilized:  Visual Auditory Hands on  Handouts given during visit include:  Celiac healthy eating tips  GF meal chart  Barriers to learning/adherence to lifestyle change: none  Demonstrated degree of understanding via:  Teach Back   Monitoring/Evaluation:  Dietary intake, exercise, CD sxs, and body weight prn.

## 2020-06-21 DIAGNOSIS — J301 Allergic rhinitis due to pollen: Secondary | ICD-10-CM | POA: Diagnosis not present

## 2020-06-21 DIAGNOSIS — J3081 Allergic rhinitis due to animal (cat) (dog) hair and dander: Secondary | ICD-10-CM | POA: Diagnosis not present

## 2020-06-21 DIAGNOSIS — J3089 Other allergic rhinitis: Secondary | ICD-10-CM | POA: Diagnosis not present

## 2020-06-26 DIAGNOSIS — K900 Celiac disease: Secondary | ICD-10-CM | POA: Insufficient documentation

## 2020-06-28 DIAGNOSIS — J3089 Other allergic rhinitis: Secondary | ICD-10-CM | POA: Diagnosis not present

## 2020-06-28 DIAGNOSIS — J3081 Allergic rhinitis due to animal (cat) (dog) hair and dander: Secondary | ICD-10-CM | POA: Diagnosis not present

## 2020-06-28 DIAGNOSIS — J301 Allergic rhinitis due to pollen: Secondary | ICD-10-CM | POA: Diagnosis not present

## 2020-07-05 DIAGNOSIS — J3089 Other allergic rhinitis: Secondary | ICD-10-CM | POA: Diagnosis not present

## 2020-07-05 DIAGNOSIS — J301 Allergic rhinitis due to pollen: Secondary | ICD-10-CM | POA: Diagnosis not present

## 2020-07-05 DIAGNOSIS — J3081 Allergic rhinitis due to animal (cat) (dog) hair and dander: Secondary | ICD-10-CM | POA: Diagnosis not present

## 2020-07-18 DIAGNOSIS — G4733 Obstructive sleep apnea (adult) (pediatric): Secondary | ICD-10-CM | POA: Diagnosis not present

## 2020-07-19 DIAGNOSIS — J301 Allergic rhinitis due to pollen: Secondary | ICD-10-CM | POA: Diagnosis not present

## 2020-07-19 DIAGNOSIS — J3081 Allergic rhinitis due to animal (cat) (dog) hair and dander: Secondary | ICD-10-CM | POA: Diagnosis not present

## 2020-07-19 DIAGNOSIS — J3089 Other allergic rhinitis: Secondary | ICD-10-CM | POA: Diagnosis not present

## 2020-07-26 DIAGNOSIS — J301 Allergic rhinitis due to pollen: Secondary | ICD-10-CM | POA: Diagnosis not present

## 2020-07-26 DIAGNOSIS — J3081 Allergic rhinitis due to animal (cat) (dog) hair and dander: Secondary | ICD-10-CM | POA: Diagnosis not present

## 2020-07-26 DIAGNOSIS — J3089 Other allergic rhinitis: Secondary | ICD-10-CM | POA: Diagnosis not present

## 2020-08-02 DIAGNOSIS — J3081 Allergic rhinitis due to animal (cat) (dog) hair and dander: Secondary | ICD-10-CM | POA: Diagnosis not present

## 2020-08-02 DIAGNOSIS — J3089 Other allergic rhinitis: Secondary | ICD-10-CM | POA: Diagnosis not present

## 2020-08-02 DIAGNOSIS — J301 Allergic rhinitis due to pollen: Secondary | ICD-10-CM | POA: Diagnosis not present

## 2020-08-07 DIAGNOSIS — S46001A Unspecified injury of muscle(s) and tendon(s) of the rotator cuff of right shoulder, initial encounter: Secondary | ICD-10-CM | POA: Diagnosis not present

## 2020-08-09 DIAGNOSIS — J3081 Allergic rhinitis due to animal (cat) (dog) hair and dander: Secondary | ICD-10-CM | POA: Diagnosis not present

## 2020-08-09 DIAGNOSIS — J3089 Other allergic rhinitis: Secondary | ICD-10-CM | POA: Diagnosis not present

## 2020-08-09 DIAGNOSIS — J301 Allergic rhinitis due to pollen: Secondary | ICD-10-CM | POA: Diagnosis not present

## 2020-08-11 DIAGNOSIS — Z1231 Encounter for screening mammogram for malignant neoplasm of breast: Secondary | ICD-10-CM | POA: Diagnosis not present

## 2020-08-11 DIAGNOSIS — S46001D Unspecified injury of muscle(s) and tendon(s) of the rotator cuff of right shoulder, subsequent encounter: Secondary | ICD-10-CM | POA: Diagnosis not present

## 2020-08-16 DIAGNOSIS — J3089 Other allergic rhinitis: Secondary | ICD-10-CM | POA: Diagnosis not present

## 2020-08-16 DIAGNOSIS — J3081 Allergic rhinitis due to animal (cat) (dog) hair and dander: Secondary | ICD-10-CM | POA: Diagnosis not present

## 2020-08-16 DIAGNOSIS — J301 Allergic rhinitis due to pollen: Secondary | ICD-10-CM | POA: Diagnosis not present

## 2020-08-16 DIAGNOSIS — S46001D Unspecified injury of muscle(s) and tendon(s) of the rotator cuff of right shoulder, subsequent encounter: Secondary | ICD-10-CM | POA: Diagnosis not present

## 2020-08-18 DIAGNOSIS — S46001D Unspecified injury of muscle(s) and tendon(s) of the rotator cuff of right shoulder, subsequent encounter: Secondary | ICD-10-CM | POA: Diagnosis not present

## 2020-08-28 DIAGNOSIS — S46001D Unspecified injury of muscle(s) and tendon(s) of the rotator cuff of right shoulder, subsequent encounter: Secondary | ICD-10-CM | POA: Diagnosis not present

## 2020-08-30 DIAGNOSIS — J3081 Allergic rhinitis due to animal (cat) (dog) hair and dander: Secondary | ICD-10-CM | POA: Diagnosis not present

## 2020-08-30 DIAGNOSIS — J301 Allergic rhinitis due to pollen: Secondary | ICD-10-CM | POA: Diagnosis not present

## 2020-08-30 DIAGNOSIS — J3089 Other allergic rhinitis: Secondary | ICD-10-CM | POA: Diagnosis not present

## 2020-08-31 DIAGNOSIS — S46001D Unspecified injury of muscle(s) and tendon(s) of the rotator cuff of right shoulder, subsequent encounter: Secondary | ICD-10-CM | POA: Diagnosis not present

## 2020-09-12 DIAGNOSIS — S46001D Unspecified injury of muscle(s) and tendon(s) of the rotator cuff of right shoulder, subsequent encounter: Secondary | ICD-10-CM | POA: Diagnosis not present

## 2020-09-13 DIAGNOSIS — J3089 Other allergic rhinitis: Secondary | ICD-10-CM | POA: Diagnosis not present

## 2020-09-13 DIAGNOSIS — J3081 Allergic rhinitis due to animal (cat) (dog) hair and dander: Secondary | ICD-10-CM | POA: Diagnosis not present

## 2020-09-13 DIAGNOSIS — J301 Allergic rhinitis due to pollen: Secondary | ICD-10-CM | POA: Diagnosis not present

## 2020-09-15 DIAGNOSIS — S46001D Unspecified injury of muscle(s) and tendon(s) of the rotator cuff of right shoulder, subsequent encounter: Secondary | ICD-10-CM | POA: Diagnosis not present

## 2020-09-18 DIAGNOSIS — S46001D Unspecified injury of muscle(s) and tendon(s) of the rotator cuff of right shoulder, subsequent encounter: Secondary | ICD-10-CM | POA: Diagnosis not present

## 2020-09-20 DIAGNOSIS — J301 Allergic rhinitis due to pollen: Secondary | ICD-10-CM | POA: Diagnosis not present

## 2020-09-20 DIAGNOSIS — J3081 Allergic rhinitis due to animal (cat) (dog) hair and dander: Secondary | ICD-10-CM | POA: Diagnosis not present

## 2020-09-20 DIAGNOSIS — J3089 Other allergic rhinitis: Secondary | ICD-10-CM | POA: Diagnosis not present

## 2020-09-21 DIAGNOSIS — S46001D Unspecified injury of muscle(s) and tendon(s) of the rotator cuff of right shoulder, subsequent encounter: Secondary | ICD-10-CM | POA: Diagnosis not present

## 2020-09-25 DIAGNOSIS — E876 Hypokalemia: Secondary | ICD-10-CM | POA: Diagnosis not present

## 2020-09-25 DIAGNOSIS — R197 Diarrhea, unspecified: Secondary | ICD-10-CM | POA: Diagnosis not present

## 2020-09-25 DIAGNOSIS — E1165 Type 2 diabetes mellitus with hyperglycemia: Secondary | ICD-10-CM | POA: Diagnosis not present

## 2020-09-25 DIAGNOSIS — E1159 Type 2 diabetes mellitus with other circulatory complications: Secondary | ICD-10-CM | POA: Diagnosis not present

## 2020-09-27 DIAGNOSIS — J301 Allergic rhinitis due to pollen: Secondary | ICD-10-CM | POA: Diagnosis not present

## 2020-09-27 DIAGNOSIS — J3089 Other allergic rhinitis: Secondary | ICD-10-CM | POA: Diagnosis not present

## 2020-09-27 DIAGNOSIS — J3081 Allergic rhinitis due to animal (cat) (dog) hair and dander: Secondary | ICD-10-CM | POA: Diagnosis not present

## 2020-09-28 DIAGNOSIS — Z0000 Encounter for general adult medical examination without abnormal findings: Secondary | ICD-10-CM | POA: Diagnosis not present

## 2020-09-28 DIAGNOSIS — E785 Hyperlipidemia, unspecified: Secondary | ICD-10-CM | POA: Diagnosis not present

## 2020-09-28 DIAGNOSIS — L718 Other rosacea: Secondary | ICD-10-CM | POA: Diagnosis not present

## 2020-09-28 DIAGNOSIS — L218 Other seborrheic dermatitis: Secondary | ICD-10-CM | POA: Diagnosis not present

## 2020-09-28 DIAGNOSIS — E1159 Type 2 diabetes mellitus with other circulatory complications: Secondary | ICD-10-CM | POA: Diagnosis not present

## 2020-09-28 DIAGNOSIS — I10 Essential (primary) hypertension: Secondary | ICD-10-CM | POA: Diagnosis not present

## 2020-09-28 DIAGNOSIS — Z23 Encounter for immunization: Secondary | ICD-10-CM | POA: Diagnosis not present

## 2020-09-28 DIAGNOSIS — S46001A Unspecified injury of muscle(s) and tendon(s) of the rotator cuff of right shoulder, initial encounter: Secondary | ICD-10-CM | POA: Diagnosis not present

## 2020-09-28 DIAGNOSIS — D2272 Melanocytic nevi of left lower limb, including hip: Secondary | ICD-10-CM | POA: Diagnosis not present

## 2020-09-28 DIAGNOSIS — D2372 Other benign neoplasm of skin of left lower limb, including hip: Secondary | ICD-10-CM | POA: Diagnosis not present

## 2020-09-29 DIAGNOSIS — S46001D Unspecified injury of muscle(s) and tendon(s) of the rotator cuff of right shoulder, subsequent encounter: Secondary | ICD-10-CM | POA: Diagnosis not present

## 2020-10-03 DIAGNOSIS — S46001D Unspecified injury of muscle(s) and tendon(s) of the rotator cuff of right shoulder, subsequent encounter: Secondary | ICD-10-CM | POA: Diagnosis not present

## 2020-10-04 DIAGNOSIS — J3089 Other allergic rhinitis: Secondary | ICD-10-CM | POA: Diagnosis not present

## 2020-10-04 DIAGNOSIS — J3081 Allergic rhinitis due to animal (cat) (dog) hair and dander: Secondary | ICD-10-CM | POA: Diagnosis not present

## 2020-10-04 DIAGNOSIS — J301 Allergic rhinitis due to pollen: Secondary | ICD-10-CM | POA: Diagnosis not present

## 2020-10-05 DIAGNOSIS — M25511 Pain in right shoulder: Secondary | ICD-10-CM | POA: Diagnosis not present

## 2020-10-06 DIAGNOSIS — S46001D Unspecified injury of muscle(s) and tendon(s) of the rotator cuff of right shoulder, subsequent encounter: Secondary | ICD-10-CM | POA: Diagnosis not present

## 2020-10-11 DIAGNOSIS — S46001D Unspecified injury of muscle(s) and tendon(s) of the rotator cuff of right shoulder, subsequent encounter: Secondary | ICD-10-CM | POA: Diagnosis not present

## 2020-10-11 DIAGNOSIS — J3089 Other allergic rhinitis: Secondary | ICD-10-CM | POA: Diagnosis not present

## 2020-10-11 DIAGNOSIS — J3081 Allergic rhinitis due to animal (cat) (dog) hair and dander: Secondary | ICD-10-CM | POA: Diagnosis not present

## 2020-10-11 DIAGNOSIS — J301 Allergic rhinitis due to pollen: Secondary | ICD-10-CM | POA: Diagnosis not present

## 2020-10-18 DIAGNOSIS — J3081 Allergic rhinitis due to animal (cat) (dog) hair and dander: Secondary | ICD-10-CM | POA: Diagnosis not present

## 2020-10-18 DIAGNOSIS — J301 Allergic rhinitis due to pollen: Secondary | ICD-10-CM | POA: Diagnosis not present

## 2020-10-18 DIAGNOSIS — J3089 Other allergic rhinitis: Secondary | ICD-10-CM | POA: Diagnosis not present

## 2020-10-18 DIAGNOSIS — G4733 Obstructive sleep apnea (adult) (pediatric): Secondary | ICD-10-CM | POA: Diagnosis not present

## 2020-10-20 DIAGNOSIS — S46001D Unspecified injury of muscle(s) and tendon(s) of the rotator cuff of right shoulder, subsequent encounter: Secondary | ICD-10-CM | POA: Diagnosis not present

## 2020-10-24 DIAGNOSIS — S46001D Unspecified injury of muscle(s) and tendon(s) of the rotator cuff of right shoulder, subsequent encounter: Secondary | ICD-10-CM | POA: Diagnosis not present

## 2020-10-27 DIAGNOSIS — S46001D Unspecified injury of muscle(s) and tendon(s) of the rotator cuff of right shoulder, subsequent encounter: Secondary | ICD-10-CM | POA: Diagnosis not present

## 2020-10-27 DIAGNOSIS — J3081 Allergic rhinitis due to animal (cat) (dog) hair and dander: Secondary | ICD-10-CM | POA: Diagnosis not present

## 2020-10-27 DIAGNOSIS — J301 Allergic rhinitis due to pollen: Secondary | ICD-10-CM | POA: Diagnosis not present

## 2020-10-27 DIAGNOSIS — J3089 Other allergic rhinitis: Secondary | ICD-10-CM | POA: Diagnosis not present

## 2020-11-01 DIAGNOSIS — J3089 Other allergic rhinitis: Secondary | ICD-10-CM | POA: Diagnosis not present

## 2020-11-01 DIAGNOSIS — J3081 Allergic rhinitis due to animal (cat) (dog) hair and dander: Secondary | ICD-10-CM | POA: Diagnosis not present

## 2020-11-01 DIAGNOSIS — J301 Allergic rhinitis due to pollen: Secondary | ICD-10-CM | POA: Diagnosis not present

## 2020-11-01 DIAGNOSIS — S46001D Unspecified injury of muscle(s) and tendon(s) of the rotator cuff of right shoulder, subsequent encounter: Secondary | ICD-10-CM | POA: Diagnosis not present

## 2020-11-03 DIAGNOSIS — S46001D Unspecified injury of muscle(s) and tendon(s) of the rotator cuff of right shoulder, subsequent encounter: Secondary | ICD-10-CM | POA: Diagnosis not present

## 2020-11-06 DIAGNOSIS — J3089 Other allergic rhinitis: Secondary | ICD-10-CM | POA: Diagnosis not present

## 2020-11-06 DIAGNOSIS — J301 Allergic rhinitis due to pollen: Secondary | ICD-10-CM | POA: Diagnosis not present

## 2020-11-06 DIAGNOSIS — J3081 Allergic rhinitis due to animal (cat) (dog) hair and dander: Secondary | ICD-10-CM | POA: Diagnosis not present

## 2020-11-06 DIAGNOSIS — H1045 Other chronic allergic conjunctivitis: Secondary | ICD-10-CM | POA: Diagnosis not present

## 2020-11-07 DIAGNOSIS — S46001D Unspecified injury of muscle(s) and tendon(s) of the rotator cuff of right shoulder, subsequent encounter: Secondary | ICD-10-CM | POA: Diagnosis not present

## 2020-11-09 DIAGNOSIS — S46001D Unspecified injury of muscle(s) and tendon(s) of the rotator cuff of right shoulder, subsequent encounter: Secondary | ICD-10-CM | POA: Diagnosis not present

## 2020-11-10 DIAGNOSIS — J301 Allergic rhinitis due to pollen: Secondary | ICD-10-CM | POA: Diagnosis not present

## 2020-11-10 DIAGNOSIS — J3081 Allergic rhinitis due to animal (cat) (dog) hair and dander: Secondary | ICD-10-CM | POA: Diagnosis not present

## 2020-11-10 DIAGNOSIS — J3089 Other allergic rhinitis: Secondary | ICD-10-CM | POA: Diagnosis not present

## 2020-11-20 DIAGNOSIS — R1013 Epigastric pain: Secondary | ICD-10-CM | POA: Diagnosis not present

## 2020-11-21 DIAGNOSIS — J301 Allergic rhinitis due to pollen: Secondary | ICD-10-CM | POA: Diagnosis not present

## 2020-11-22 DIAGNOSIS — J3081 Allergic rhinitis due to animal (cat) (dog) hair and dander: Secondary | ICD-10-CM | POA: Diagnosis not present

## 2020-11-22 DIAGNOSIS — J3089 Other allergic rhinitis: Secondary | ICD-10-CM | POA: Diagnosis not present

## 2020-11-22 DIAGNOSIS — M25511 Pain in right shoulder: Secondary | ICD-10-CM | POA: Diagnosis not present

## 2020-11-22 DIAGNOSIS — J301 Allergic rhinitis due to pollen: Secondary | ICD-10-CM | POA: Diagnosis not present

## 2020-11-28 DIAGNOSIS — S46001D Unspecified injury of muscle(s) and tendon(s) of the rotator cuff of right shoulder, subsequent encounter: Secondary | ICD-10-CM | POA: Diagnosis not present

## 2020-11-29 DIAGNOSIS — J301 Allergic rhinitis due to pollen: Secondary | ICD-10-CM | POA: Diagnosis not present

## 2020-11-29 DIAGNOSIS — R109 Unspecified abdominal pain: Secondary | ICD-10-CM | POA: Diagnosis not present

## 2020-11-29 DIAGNOSIS — J3081 Allergic rhinitis due to animal (cat) (dog) hair and dander: Secondary | ICD-10-CM | POA: Diagnosis not present

## 2020-11-29 DIAGNOSIS — J3089 Other allergic rhinitis: Secondary | ICD-10-CM | POA: Diagnosis not present

## 2020-12-01 DIAGNOSIS — S46001D Unspecified injury of muscle(s) and tendon(s) of the rotator cuff of right shoulder, subsequent encounter: Secondary | ICD-10-CM | POA: Diagnosis not present

## 2020-12-04 DIAGNOSIS — S46001D Unspecified injury of muscle(s) and tendon(s) of the rotator cuff of right shoulder, subsequent encounter: Secondary | ICD-10-CM | POA: Diagnosis not present

## 2020-12-06 DIAGNOSIS — J301 Allergic rhinitis due to pollen: Secondary | ICD-10-CM | POA: Diagnosis not present

## 2020-12-06 DIAGNOSIS — J3089 Other allergic rhinitis: Secondary | ICD-10-CM | POA: Diagnosis not present

## 2020-12-06 DIAGNOSIS — J3081 Allergic rhinitis due to animal (cat) (dog) hair and dander: Secondary | ICD-10-CM | POA: Diagnosis not present

## 2020-12-07 DIAGNOSIS — S46001D Unspecified injury of muscle(s) and tendon(s) of the rotator cuff of right shoulder, subsequent encounter: Secondary | ICD-10-CM | POA: Diagnosis not present

## 2020-12-11 DIAGNOSIS — S46001D Unspecified injury of muscle(s) and tendon(s) of the rotator cuff of right shoulder, subsequent encounter: Secondary | ICD-10-CM | POA: Diagnosis not present

## 2020-12-13 DIAGNOSIS — J3081 Allergic rhinitis due to animal (cat) (dog) hair and dander: Secondary | ICD-10-CM | POA: Diagnosis not present

## 2020-12-13 DIAGNOSIS — J301 Allergic rhinitis due to pollen: Secondary | ICD-10-CM | POA: Diagnosis not present

## 2020-12-13 DIAGNOSIS — J3089 Other allergic rhinitis: Secondary | ICD-10-CM | POA: Diagnosis not present

## 2020-12-15 DIAGNOSIS — S46001D Unspecified injury of muscle(s) and tendon(s) of the rotator cuff of right shoulder, subsequent encounter: Secondary | ICD-10-CM | POA: Diagnosis not present

## 2020-12-18 DIAGNOSIS — S46001D Unspecified injury of muscle(s) and tendon(s) of the rotator cuff of right shoulder, subsequent encounter: Secondary | ICD-10-CM | POA: Diagnosis not present

## 2020-12-20 DIAGNOSIS — J3089 Other allergic rhinitis: Secondary | ICD-10-CM | POA: Diagnosis not present

## 2020-12-20 DIAGNOSIS — J301 Allergic rhinitis due to pollen: Secondary | ICD-10-CM | POA: Diagnosis not present

## 2020-12-20 DIAGNOSIS — J3081 Allergic rhinitis due to animal (cat) (dog) hair and dander: Secondary | ICD-10-CM | POA: Diagnosis not present

## 2020-12-25 DIAGNOSIS — S46001D Unspecified injury of muscle(s) and tendon(s) of the rotator cuff of right shoulder, subsequent encounter: Secondary | ICD-10-CM | POA: Diagnosis not present

## 2020-12-27 DIAGNOSIS — J3081 Allergic rhinitis due to animal (cat) (dog) hair and dander: Secondary | ICD-10-CM | POA: Diagnosis not present

## 2020-12-27 DIAGNOSIS — J301 Allergic rhinitis due to pollen: Secondary | ICD-10-CM | POA: Diagnosis not present

## 2020-12-27 DIAGNOSIS — J3089 Other allergic rhinitis: Secondary | ICD-10-CM | POA: Diagnosis not present

## 2021-01-02 DIAGNOSIS — Z9884 Bariatric surgery status: Secondary | ICD-10-CM | POA: Diagnosis not present

## 2021-01-03 DIAGNOSIS — M25552 Pain in left hip: Secondary | ICD-10-CM | POA: Diagnosis not present

## 2021-01-03 DIAGNOSIS — J3089 Other allergic rhinitis: Secondary | ICD-10-CM | POA: Diagnosis not present

## 2021-01-03 DIAGNOSIS — J3081 Allergic rhinitis due to animal (cat) (dog) hair and dander: Secondary | ICD-10-CM | POA: Diagnosis not present

## 2021-01-03 DIAGNOSIS — J301 Allergic rhinitis due to pollen: Secondary | ICD-10-CM | POA: Diagnosis not present

## 2021-01-03 DIAGNOSIS — M25511 Pain in right shoulder: Secondary | ICD-10-CM | POA: Diagnosis not present

## 2021-01-08 ENCOUNTER — Ambulatory Visit
Admission: RE | Admit: 2021-01-08 | Discharge: 2021-01-08 | Disposition: A | Discharge status: Home / Self Care | Payer: Federal, State, Local not specified - PPO | Source: Ambulatory Visit | Attending: Sports Medicine | Admitting: Sports Medicine

## 2021-01-08 ENCOUNTER — Other Ambulatory Visit: Payer: Self-pay | Admitting: Sports Medicine

## 2021-01-08 DIAGNOSIS — M25552 Pain in left hip: Secondary | ICD-10-CM | POA: Diagnosis not present

## 2021-01-10 DIAGNOSIS — J3081 Allergic rhinitis due to animal (cat) (dog) hair and dander: Secondary | ICD-10-CM | POA: Diagnosis not present

## 2021-01-10 DIAGNOSIS — J301 Allergic rhinitis due to pollen: Secondary | ICD-10-CM | POA: Diagnosis not present

## 2021-01-10 DIAGNOSIS — J3089 Other allergic rhinitis: Secondary | ICD-10-CM | POA: Diagnosis not present

## 2021-01-17 DIAGNOSIS — J301 Allergic rhinitis due to pollen: Secondary | ICD-10-CM | POA: Diagnosis not present

## 2021-01-17 DIAGNOSIS — J3081 Allergic rhinitis due to animal (cat) (dog) hair and dander: Secondary | ICD-10-CM | POA: Diagnosis not present

## 2021-01-17 DIAGNOSIS — J3089 Other allergic rhinitis: Secondary | ICD-10-CM | POA: Diagnosis not present

## 2021-01-17 DIAGNOSIS — G4733 Obstructive sleep apnea (adult) (pediatric): Secondary | ICD-10-CM | POA: Diagnosis not present

## 2021-01-18 DIAGNOSIS — R1013 Epigastric pain: Secondary | ICD-10-CM | POA: Diagnosis not present

## 2021-01-18 DIAGNOSIS — R1310 Dysphagia, unspecified: Secondary | ICD-10-CM | POA: Diagnosis not present

## 2021-01-24 DIAGNOSIS — J3089 Other allergic rhinitis: Secondary | ICD-10-CM | POA: Diagnosis not present

## 2021-01-24 DIAGNOSIS — J3081 Allergic rhinitis due to animal (cat) (dog) hair and dander: Secondary | ICD-10-CM | POA: Diagnosis not present

## 2021-01-24 DIAGNOSIS — J301 Allergic rhinitis due to pollen: Secondary | ICD-10-CM | POA: Diagnosis not present

## 2021-01-31 DIAGNOSIS — J3089 Other allergic rhinitis: Secondary | ICD-10-CM | POA: Diagnosis not present

## 2021-01-31 DIAGNOSIS — J301 Allergic rhinitis due to pollen: Secondary | ICD-10-CM | POA: Diagnosis not present

## 2021-01-31 DIAGNOSIS — J3081 Allergic rhinitis due to animal (cat) (dog) hair and dander: Secondary | ICD-10-CM | POA: Diagnosis not present

## 2021-02-02 DIAGNOSIS — K449 Diaphragmatic hernia without obstruction or gangrene: Secondary | ICD-10-CM | POA: Diagnosis not present

## 2021-02-02 DIAGNOSIS — R1310 Dysphagia, unspecified: Secondary | ICD-10-CM | POA: Diagnosis not present

## 2021-02-02 DIAGNOSIS — R1013 Epigastric pain: Secondary | ICD-10-CM | POA: Diagnosis not present

## 2021-02-02 DIAGNOSIS — Z9884 Bariatric surgery status: Secondary | ICD-10-CM | POA: Diagnosis not present

## 2021-02-14 DIAGNOSIS — J3081 Allergic rhinitis due to animal (cat) (dog) hair and dander: Secondary | ICD-10-CM | POA: Diagnosis not present

## 2021-02-14 DIAGNOSIS — J301 Allergic rhinitis due to pollen: Secondary | ICD-10-CM | POA: Diagnosis not present

## 2021-02-14 DIAGNOSIS — J3089 Other allergic rhinitis: Secondary | ICD-10-CM | POA: Diagnosis not present

## 2021-02-21 DIAGNOSIS — J3089 Other allergic rhinitis: Secondary | ICD-10-CM | POA: Diagnosis not present

## 2021-02-21 DIAGNOSIS — J301 Allergic rhinitis due to pollen: Secondary | ICD-10-CM | POA: Diagnosis not present

## 2021-02-21 DIAGNOSIS — J3081 Allergic rhinitis due to animal (cat) (dog) hair and dander: Secondary | ICD-10-CM | POA: Diagnosis not present

## 2021-02-28 DIAGNOSIS — J3081 Allergic rhinitis due to animal (cat) (dog) hair and dander: Secondary | ICD-10-CM | POA: Diagnosis not present

## 2021-02-28 DIAGNOSIS — J3089 Other allergic rhinitis: Secondary | ICD-10-CM | POA: Diagnosis not present

## 2021-02-28 DIAGNOSIS — J301 Allergic rhinitis due to pollen: Secondary | ICD-10-CM | POA: Diagnosis not present

## 2021-03-07 DIAGNOSIS — J301 Allergic rhinitis due to pollen: Secondary | ICD-10-CM | POA: Diagnosis not present

## 2021-03-07 DIAGNOSIS — J3089 Other allergic rhinitis: Secondary | ICD-10-CM | POA: Diagnosis not present

## 2021-03-07 DIAGNOSIS — J3081 Allergic rhinitis due to animal (cat) (dog) hair and dander: Secondary | ICD-10-CM | POA: Diagnosis not present

## 2021-03-14 DIAGNOSIS — J3081 Allergic rhinitis due to animal (cat) (dog) hair and dander: Secondary | ICD-10-CM | POA: Diagnosis not present

## 2021-03-14 DIAGNOSIS — J301 Allergic rhinitis due to pollen: Secondary | ICD-10-CM | POA: Diagnosis not present

## 2021-03-14 DIAGNOSIS — J3089 Other allergic rhinitis: Secondary | ICD-10-CM | POA: Diagnosis not present

## 2021-03-21 DIAGNOSIS — J301 Allergic rhinitis due to pollen: Secondary | ICD-10-CM | POA: Diagnosis not present

## 2021-03-21 DIAGNOSIS — J3089 Other allergic rhinitis: Secondary | ICD-10-CM | POA: Diagnosis not present

## 2021-03-21 DIAGNOSIS — J3081 Allergic rhinitis due to animal (cat) (dog) hair and dander: Secondary | ICD-10-CM | POA: Diagnosis not present

## 2021-03-29 DIAGNOSIS — Z23 Encounter for immunization: Secondary | ICD-10-CM | POA: Diagnosis not present

## 2021-03-29 DIAGNOSIS — E785 Hyperlipidemia, unspecified: Secondary | ICD-10-CM | POA: Diagnosis not present

## 2021-03-29 DIAGNOSIS — I10 Essential (primary) hypertension: Secondary | ICD-10-CM | POA: Diagnosis not present

## 2021-03-29 DIAGNOSIS — E1165 Type 2 diabetes mellitus with hyperglycemia: Secondary | ICD-10-CM | POA: Diagnosis not present

## 2021-03-29 DIAGNOSIS — E1159 Type 2 diabetes mellitus with other circulatory complications: Secondary | ICD-10-CM | POA: Diagnosis not present

## 2021-04-03 DIAGNOSIS — I10 Essential (primary) hypertension: Secondary | ICD-10-CM | POA: Diagnosis not present

## 2021-04-03 DIAGNOSIS — E1159 Type 2 diabetes mellitus with other circulatory complications: Secondary | ICD-10-CM | POA: Diagnosis not present

## 2021-04-03 DIAGNOSIS — J3081 Allergic rhinitis due to animal (cat) (dog) hair and dander: Secondary | ICD-10-CM | POA: Diagnosis not present

## 2021-04-03 DIAGNOSIS — R1310 Dysphagia, unspecified: Secondary | ICD-10-CM | POA: Diagnosis not present

## 2021-04-03 DIAGNOSIS — J301 Allergic rhinitis due to pollen: Secondary | ICD-10-CM | POA: Diagnosis not present

## 2021-04-03 DIAGNOSIS — E785 Hyperlipidemia, unspecified: Secondary | ICD-10-CM | POA: Diagnosis not present

## 2021-04-03 DIAGNOSIS — J3089 Other allergic rhinitis: Secondary | ICD-10-CM | POA: Diagnosis not present

## 2021-04-06 DIAGNOSIS — J301 Allergic rhinitis due to pollen: Secondary | ICD-10-CM | POA: Diagnosis not present

## 2021-04-06 DIAGNOSIS — J3089 Other allergic rhinitis: Secondary | ICD-10-CM | POA: Diagnosis not present

## 2021-04-06 DIAGNOSIS — J3081 Allergic rhinitis due to animal (cat) (dog) hair and dander: Secondary | ICD-10-CM | POA: Diagnosis not present

## 2021-04-11 DIAGNOSIS — J3089 Other allergic rhinitis: Secondary | ICD-10-CM | POA: Diagnosis not present

## 2021-04-11 DIAGNOSIS — J301 Allergic rhinitis due to pollen: Secondary | ICD-10-CM | POA: Diagnosis not present

## 2021-04-11 DIAGNOSIS — J3081 Allergic rhinitis due to animal (cat) (dog) hair and dander: Secondary | ICD-10-CM | POA: Diagnosis not present

## 2021-04-17 DIAGNOSIS — R400 Somnolence: Secondary | ICD-10-CM | POA: Diagnosis not present

## 2021-04-17 DIAGNOSIS — G4733 Obstructive sleep apnea (adult) (pediatric): Secondary | ICD-10-CM | POA: Diagnosis not present

## 2021-04-18 DIAGNOSIS — J3089 Other allergic rhinitis: Secondary | ICD-10-CM | POA: Diagnosis not present

## 2021-04-18 DIAGNOSIS — J301 Allergic rhinitis due to pollen: Secondary | ICD-10-CM | POA: Diagnosis not present

## 2021-04-18 DIAGNOSIS — J3081 Allergic rhinitis due to animal (cat) (dog) hair and dander: Secondary | ICD-10-CM | POA: Diagnosis not present

## 2021-04-25 DIAGNOSIS — J3081 Allergic rhinitis due to animal (cat) (dog) hair and dander: Secondary | ICD-10-CM | POA: Diagnosis not present

## 2021-04-25 DIAGNOSIS — J301 Allergic rhinitis due to pollen: Secondary | ICD-10-CM | POA: Diagnosis not present

## 2021-04-25 DIAGNOSIS — J3089 Other allergic rhinitis: Secondary | ICD-10-CM | POA: Diagnosis not present

## 2021-05-02 DIAGNOSIS — J3089 Other allergic rhinitis: Secondary | ICD-10-CM | POA: Diagnosis not present

## 2021-05-02 DIAGNOSIS — J301 Allergic rhinitis due to pollen: Secondary | ICD-10-CM | POA: Diagnosis not present

## 2021-05-02 DIAGNOSIS — J3081 Allergic rhinitis due to animal (cat) (dog) hair and dander: Secondary | ICD-10-CM | POA: Diagnosis not present

## 2021-05-09 DIAGNOSIS — J301 Allergic rhinitis due to pollen: Secondary | ICD-10-CM | POA: Diagnosis not present

## 2021-05-09 DIAGNOSIS — J3089 Other allergic rhinitis: Secondary | ICD-10-CM | POA: Diagnosis not present

## 2021-05-09 DIAGNOSIS — J3081 Allergic rhinitis due to animal (cat) (dog) hair and dander: Secondary | ICD-10-CM | POA: Diagnosis not present

## 2021-05-16 DIAGNOSIS — J301 Allergic rhinitis due to pollen: Secondary | ICD-10-CM | POA: Diagnosis not present

## 2021-05-16 DIAGNOSIS — J3089 Other allergic rhinitis: Secondary | ICD-10-CM | POA: Diagnosis not present

## 2021-05-16 DIAGNOSIS — J3081 Allergic rhinitis due to animal (cat) (dog) hair and dander: Secondary | ICD-10-CM | POA: Diagnosis not present

## 2021-05-30 DIAGNOSIS — J301 Allergic rhinitis due to pollen: Secondary | ICD-10-CM | POA: Diagnosis not present

## 2021-05-30 DIAGNOSIS — J3081 Allergic rhinitis due to animal (cat) (dog) hair and dander: Secondary | ICD-10-CM | POA: Diagnosis not present

## 2021-05-30 DIAGNOSIS — J3089 Other allergic rhinitis: Secondary | ICD-10-CM | POA: Diagnosis not present

## 2021-06-06 DIAGNOSIS — J3089 Other allergic rhinitis: Secondary | ICD-10-CM | POA: Diagnosis not present

## 2021-06-06 DIAGNOSIS — J301 Allergic rhinitis due to pollen: Secondary | ICD-10-CM | POA: Diagnosis not present

## 2021-06-06 DIAGNOSIS — J3081 Allergic rhinitis due to animal (cat) (dog) hair and dander: Secondary | ICD-10-CM | POA: Diagnosis not present

## 2021-06-13 DIAGNOSIS — J301 Allergic rhinitis due to pollen: Secondary | ICD-10-CM | POA: Diagnosis not present

## 2021-06-13 DIAGNOSIS — J3089 Other allergic rhinitis: Secondary | ICD-10-CM | POA: Diagnosis not present

## 2021-06-13 DIAGNOSIS — J3081 Allergic rhinitis due to animal (cat) (dog) hair and dander: Secondary | ICD-10-CM | POA: Diagnosis not present

## 2021-06-18 DIAGNOSIS — J301 Allergic rhinitis due to pollen: Secondary | ICD-10-CM | POA: Diagnosis not present

## 2021-06-19 DIAGNOSIS — J3089 Other allergic rhinitis: Secondary | ICD-10-CM | POA: Diagnosis not present

## 2021-06-19 DIAGNOSIS — J3081 Allergic rhinitis due to animal (cat) (dog) hair and dander: Secondary | ICD-10-CM | POA: Diagnosis not present

## 2021-06-20 DIAGNOSIS — J3081 Allergic rhinitis due to animal (cat) (dog) hair and dander: Secondary | ICD-10-CM | POA: Diagnosis not present

## 2021-06-20 DIAGNOSIS — J3089 Other allergic rhinitis: Secondary | ICD-10-CM | POA: Diagnosis not present

## 2021-06-20 DIAGNOSIS — J301 Allergic rhinitis due to pollen: Secondary | ICD-10-CM | POA: Diagnosis not present

## 2021-06-27 DIAGNOSIS — J3089 Other allergic rhinitis: Secondary | ICD-10-CM | POA: Diagnosis not present

## 2021-06-27 DIAGNOSIS — J3081 Allergic rhinitis due to animal (cat) (dog) hair and dander: Secondary | ICD-10-CM | POA: Diagnosis not present

## 2021-06-27 DIAGNOSIS — J301 Allergic rhinitis due to pollen: Secondary | ICD-10-CM | POA: Diagnosis not present

## 2021-07-02 DIAGNOSIS — K900 Celiac disease: Secondary | ICD-10-CM | POA: Diagnosis not present

## 2021-07-02 DIAGNOSIS — Z713 Dietary counseling and surveillance: Secondary | ICD-10-CM | POA: Diagnosis not present

## 2021-07-02 DIAGNOSIS — R1111 Vomiting without nausea: Secondary | ICD-10-CM | POA: Diagnosis not present

## 2021-07-02 DIAGNOSIS — Z9884 Bariatric surgery status: Secondary | ICD-10-CM | POA: Diagnosis not present

## 2021-07-04 DIAGNOSIS — J301 Allergic rhinitis due to pollen: Secondary | ICD-10-CM | POA: Diagnosis not present

## 2021-07-04 DIAGNOSIS — J3089 Other allergic rhinitis: Secondary | ICD-10-CM | POA: Diagnosis not present

## 2021-07-04 DIAGNOSIS — J3081 Allergic rhinitis due to animal (cat) (dog) hair and dander: Secondary | ICD-10-CM | POA: Diagnosis not present

## 2021-07-06 DIAGNOSIS — M21962 Unspecified acquired deformity of left lower leg: Secondary | ICD-10-CM | POA: Diagnosis not present

## 2021-07-06 DIAGNOSIS — M21612 Bunion of left foot: Secondary | ICD-10-CM | POA: Diagnosis not present

## 2021-07-11 DIAGNOSIS — J3081 Allergic rhinitis due to animal (cat) (dog) hair and dander: Secondary | ICD-10-CM | POA: Diagnosis not present

## 2021-07-11 DIAGNOSIS — J301 Allergic rhinitis due to pollen: Secondary | ICD-10-CM | POA: Diagnosis not present

## 2021-07-11 DIAGNOSIS — J3089 Other allergic rhinitis: Secondary | ICD-10-CM | POA: Diagnosis not present

## 2021-07-18 DIAGNOSIS — G4733 Obstructive sleep apnea (adult) (pediatric): Secondary | ICD-10-CM | POA: Diagnosis not present

## 2021-07-18 DIAGNOSIS — J3089 Other allergic rhinitis: Secondary | ICD-10-CM | POA: Diagnosis not present

## 2021-07-18 DIAGNOSIS — J301 Allergic rhinitis due to pollen: Secondary | ICD-10-CM | POA: Diagnosis not present

## 2021-07-18 DIAGNOSIS — J3081 Allergic rhinitis due to animal (cat) (dog) hair and dander: Secondary | ICD-10-CM | POA: Diagnosis not present

## 2021-07-25 DIAGNOSIS — J301 Allergic rhinitis due to pollen: Secondary | ICD-10-CM | POA: Diagnosis not present

## 2021-07-25 DIAGNOSIS — J3081 Allergic rhinitis due to animal (cat) (dog) hair and dander: Secondary | ICD-10-CM | POA: Diagnosis not present

## 2021-07-25 DIAGNOSIS — J3089 Other allergic rhinitis: Secondary | ICD-10-CM | POA: Diagnosis not present

## 2021-08-01 DIAGNOSIS — J301 Allergic rhinitis due to pollen: Secondary | ICD-10-CM | POA: Diagnosis not present

## 2021-08-01 DIAGNOSIS — J3089 Other allergic rhinitis: Secondary | ICD-10-CM | POA: Diagnosis not present

## 2021-08-01 DIAGNOSIS — J3081 Allergic rhinitis due to animal (cat) (dog) hair and dander: Secondary | ICD-10-CM | POA: Diagnosis not present

## 2021-08-08 DIAGNOSIS — J3081 Allergic rhinitis due to animal (cat) (dog) hair and dander: Secondary | ICD-10-CM | POA: Diagnosis not present

## 2021-08-08 DIAGNOSIS — J3089 Other allergic rhinitis: Secondary | ICD-10-CM | POA: Diagnosis not present

## 2021-08-08 DIAGNOSIS — J301 Allergic rhinitis due to pollen: Secondary | ICD-10-CM | POA: Diagnosis not present

## 2021-08-13 DIAGNOSIS — Z1231 Encounter for screening mammogram for malignant neoplasm of breast: Secondary | ICD-10-CM | POA: Diagnosis not present

## 2021-08-15 DIAGNOSIS — J3081 Allergic rhinitis due to animal (cat) (dog) hair and dander: Secondary | ICD-10-CM | POA: Diagnosis not present

## 2021-08-15 DIAGNOSIS — J3089 Other allergic rhinitis: Secondary | ICD-10-CM | POA: Diagnosis not present

## 2021-08-15 DIAGNOSIS — J301 Allergic rhinitis due to pollen: Secondary | ICD-10-CM | POA: Diagnosis not present

## 2021-08-22 DIAGNOSIS — J301 Allergic rhinitis due to pollen: Secondary | ICD-10-CM | POA: Diagnosis not present

## 2021-08-22 DIAGNOSIS — J3089 Other allergic rhinitis: Secondary | ICD-10-CM | POA: Diagnosis not present

## 2021-08-22 DIAGNOSIS — J3081 Allergic rhinitis due to animal (cat) (dog) hair and dander: Secondary | ICD-10-CM | POA: Diagnosis not present

## 2021-09-05 DIAGNOSIS — J3081 Allergic rhinitis due to animal (cat) (dog) hair and dander: Secondary | ICD-10-CM | POA: Diagnosis not present

## 2021-09-05 DIAGNOSIS — J301 Allergic rhinitis due to pollen: Secondary | ICD-10-CM | POA: Diagnosis not present

## 2021-09-05 DIAGNOSIS — J3089 Other allergic rhinitis: Secondary | ICD-10-CM | POA: Diagnosis not present

## 2021-09-12 DIAGNOSIS — J3081 Allergic rhinitis due to animal (cat) (dog) hair and dander: Secondary | ICD-10-CM | POA: Diagnosis not present

## 2021-09-12 DIAGNOSIS — J301 Allergic rhinitis due to pollen: Secondary | ICD-10-CM | POA: Diagnosis not present

## 2021-09-12 DIAGNOSIS — J3089 Other allergic rhinitis: Secondary | ICD-10-CM | POA: Diagnosis not present

## 2021-09-19 DIAGNOSIS — J3081 Allergic rhinitis due to animal (cat) (dog) hair and dander: Secondary | ICD-10-CM | POA: Diagnosis not present

## 2021-09-19 DIAGNOSIS — J3089 Other allergic rhinitis: Secondary | ICD-10-CM | POA: Diagnosis not present

## 2021-09-19 DIAGNOSIS — J301 Allergic rhinitis due to pollen: Secondary | ICD-10-CM | POA: Diagnosis not present

## 2021-09-26 DIAGNOSIS — J3081 Allergic rhinitis due to animal (cat) (dog) hair and dander: Secondary | ICD-10-CM | POA: Diagnosis not present

## 2021-09-26 DIAGNOSIS — J301 Allergic rhinitis due to pollen: Secondary | ICD-10-CM | POA: Diagnosis not present

## 2021-09-26 DIAGNOSIS — J3089 Other allergic rhinitis: Secondary | ICD-10-CM | POA: Diagnosis not present

## 2021-09-27 DIAGNOSIS — E1159 Type 2 diabetes mellitus with other circulatory complications: Secondary | ICD-10-CM | POA: Diagnosis not present

## 2021-10-03 DIAGNOSIS — J301 Allergic rhinitis due to pollen: Secondary | ICD-10-CM | POA: Diagnosis not present

## 2021-10-03 DIAGNOSIS — J3089 Other allergic rhinitis: Secondary | ICD-10-CM | POA: Diagnosis not present

## 2021-10-03 DIAGNOSIS — J3081 Allergic rhinitis due to animal (cat) (dog) hair and dander: Secondary | ICD-10-CM | POA: Diagnosis not present

## 2021-10-04 DIAGNOSIS — Z23 Encounter for immunization: Secondary | ICD-10-CM | POA: Diagnosis not present

## 2021-10-04 DIAGNOSIS — Z0000 Encounter for general adult medical examination without abnormal findings: Secondary | ICD-10-CM | POA: Diagnosis not present

## 2021-10-04 DIAGNOSIS — I10 Essential (primary) hypertension: Secondary | ICD-10-CM | POA: Diagnosis not present

## 2021-10-04 DIAGNOSIS — R142 Eructation: Secondary | ICD-10-CM | POA: Diagnosis not present

## 2021-10-04 DIAGNOSIS — E785 Hyperlipidemia, unspecified: Secondary | ICD-10-CM | POA: Diagnosis not present

## 2021-10-04 DIAGNOSIS — Z124 Encounter for screening for malignant neoplasm of cervix: Secondary | ICD-10-CM | POA: Diagnosis not present

## 2021-10-04 DIAGNOSIS — E1159 Type 2 diabetes mellitus with other circulatory complications: Secondary | ICD-10-CM | POA: Diagnosis not present

## 2021-10-10 DIAGNOSIS — J3081 Allergic rhinitis due to animal (cat) (dog) hair and dander: Secondary | ICD-10-CM | POA: Diagnosis not present

## 2021-10-10 DIAGNOSIS — J3089 Other allergic rhinitis: Secondary | ICD-10-CM | POA: Diagnosis not present

## 2021-10-10 DIAGNOSIS — J301 Allergic rhinitis due to pollen: Secondary | ICD-10-CM | POA: Diagnosis not present

## 2021-10-17 DIAGNOSIS — J3089 Other allergic rhinitis: Secondary | ICD-10-CM | POA: Diagnosis not present

## 2021-10-17 DIAGNOSIS — J3081 Allergic rhinitis due to animal (cat) (dog) hair and dander: Secondary | ICD-10-CM | POA: Diagnosis not present

## 2021-10-17 DIAGNOSIS — J301 Allergic rhinitis due to pollen: Secondary | ICD-10-CM | POA: Diagnosis not present

## 2021-10-19 DIAGNOSIS — G4733 Obstructive sleep apnea (adult) (pediatric): Secondary | ICD-10-CM | POA: Diagnosis not present

## 2021-10-24 DIAGNOSIS — J3089 Other allergic rhinitis: Secondary | ICD-10-CM | POA: Diagnosis not present

## 2021-10-24 DIAGNOSIS — J301 Allergic rhinitis due to pollen: Secondary | ICD-10-CM | POA: Diagnosis not present

## 2021-10-24 DIAGNOSIS — J3081 Allergic rhinitis due to animal (cat) (dog) hair and dander: Secondary | ICD-10-CM | POA: Diagnosis not present

## 2021-11-02 DIAGNOSIS — J3089 Other allergic rhinitis: Secondary | ICD-10-CM | POA: Diagnosis not present

## 2021-11-02 DIAGNOSIS — J3081 Allergic rhinitis due to animal (cat) (dog) hair and dander: Secondary | ICD-10-CM | POA: Diagnosis not present

## 2021-11-02 DIAGNOSIS — J301 Allergic rhinitis due to pollen: Secondary | ICD-10-CM | POA: Diagnosis not present

## 2021-11-06 DIAGNOSIS — J3089 Other allergic rhinitis: Secondary | ICD-10-CM | POA: Diagnosis not present

## 2021-11-06 DIAGNOSIS — L718 Other rosacea: Secondary | ICD-10-CM | POA: Diagnosis not present

## 2021-11-06 DIAGNOSIS — J3081 Allergic rhinitis due to animal (cat) (dog) hair and dander: Secondary | ICD-10-CM | POA: Diagnosis not present

## 2021-11-06 DIAGNOSIS — D2372 Other benign neoplasm of skin of left lower limb, including hip: Secondary | ICD-10-CM | POA: Diagnosis not present

## 2021-11-06 DIAGNOSIS — D2272 Melanocytic nevi of left lower limb, including hip: Secondary | ICD-10-CM | POA: Diagnosis not present

## 2021-11-06 DIAGNOSIS — H1045 Other chronic allergic conjunctivitis: Secondary | ICD-10-CM | POA: Diagnosis not present

## 2021-11-06 DIAGNOSIS — D2271 Melanocytic nevi of right lower limb, including hip: Secondary | ICD-10-CM | POA: Diagnosis not present

## 2021-11-06 DIAGNOSIS — J301 Allergic rhinitis due to pollen: Secondary | ICD-10-CM | POA: Diagnosis not present

## 2021-11-07 DIAGNOSIS — J301 Allergic rhinitis due to pollen: Secondary | ICD-10-CM | POA: Diagnosis not present

## 2021-11-07 DIAGNOSIS — J3081 Allergic rhinitis due to animal (cat) (dog) hair and dander: Secondary | ICD-10-CM | POA: Diagnosis not present

## 2021-11-07 DIAGNOSIS — J3089 Other allergic rhinitis: Secondary | ICD-10-CM | POA: Diagnosis not present

## 2021-11-12 DIAGNOSIS — R42 Dizziness and giddiness: Secondary | ICD-10-CM | POA: Diagnosis not present

## 2021-11-12 DIAGNOSIS — K900 Celiac disease: Secondary | ICD-10-CM | POA: Diagnosis not present

## 2021-11-12 DIAGNOSIS — R197 Diarrhea, unspecified: Secondary | ICD-10-CM | POA: Diagnosis not present

## 2021-11-12 DIAGNOSIS — R5383 Other fatigue: Secondary | ICD-10-CM | POA: Diagnosis not present

## 2021-11-14 DIAGNOSIS — J3089 Other allergic rhinitis: Secondary | ICD-10-CM | POA: Diagnosis not present

## 2021-11-14 DIAGNOSIS — J3081 Allergic rhinitis due to animal (cat) (dog) hair and dander: Secondary | ICD-10-CM | POA: Diagnosis not present

## 2021-11-14 DIAGNOSIS — J301 Allergic rhinitis due to pollen: Secondary | ICD-10-CM | POA: Diagnosis not present

## 2021-11-16 DIAGNOSIS — R197 Diarrhea, unspecified: Secondary | ICD-10-CM | POA: Diagnosis not present

## 2021-11-26 ENCOUNTER — Other Ambulatory Visit: Payer: Self-pay | Admitting: Gastroenterology

## 2021-11-26 DIAGNOSIS — R109 Unspecified abdominal pain: Secondary | ICD-10-CM

## 2021-11-26 DIAGNOSIS — R634 Abnormal weight loss: Secondary | ICD-10-CM | POA: Diagnosis not present

## 2021-11-26 DIAGNOSIS — R197 Diarrhea, unspecified: Secondary | ICD-10-CM | POA: Diagnosis not present

## 2021-11-28 DIAGNOSIS — J3081 Allergic rhinitis due to animal (cat) (dog) hair and dander: Secondary | ICD-10-CM | POA: Diagnosis not present

## 2021-11-28 DIAGNOSIS — J301 Allergic rhinitis due to pollen: Secondary | ICD-10-CM | POA: Diagnosis not present

## 2021-11-28 DIAGNOSIS — J3089 Other allergic rhinitis: Secondary | ICD-10-CM | POA: Diagnosis not present

## 2021-11-29 ENCOUNTER — Ambulatory Visit
Admission: RE | Admit: 2021-11-29 | Discharge: 2021-11-29 | Disposition: A | Discharge status: Home / Self Care | Payer: Federal, State, Local not specified - PPO | Source: Ambulatory Visit | Attending: Gastroenterology | Admitting: Gastroenterology

## 2021-11-29 DIAGNOSIS — R197 Diarrhea, unspecified: Secondary | ICD-10-CM

## 2021-11-29 DIAGNOSIS — R109 Unspecified abdominal pain: Secondary | ICD-10-CM

## 2021-11-29 DIAGNOSIS — R634 Abnormal weight loss: Secondary | ICD-10-CM

## 2021-11-29 DIAGNOSIS — D259 Leiomyoma of uterus, unspecified: Secondary | ICD-10-CM | POA: Diagnosis not present

## 2021-11-29 MED ORDER — IOPAMIDOL (ISOVUE-300) INJECTION 61%
100.0000 mL | Freq: Once | INTRAVENOUS | Status: AC | PRN
  Administered 2021-11-29: 100 mL via INTRAVENOUS

## 2021-12-05 DIAGNOSIS — J3089 Other allergic rhinitis: Secondary | ICD-10-CM | POA: Diagnosis not present

## 2021-12-05 DIAGNOSIS — J301 Allergic rhinitis due to pollen: Secondary | ICD-10-CM | POA: Diagnosis not present

## 2021-12-05 DIAGNOSIS — J3081 Allergic rhinitis due to animal (cat) (dog) hair and dander: Secondary | ICD-10-CM | POA: Diagnosis not present

## 2021-12-12 DIAGNOSIS — J3081 Allergic rhinitis due to animal (cat) (dog) hair and dander: Secondary | ICD-10-CM | POA: Diagnosis not present

## 2021-12-12 DIAGNOSIS — J301 Allergic rhinitis due to pollen: Secondary | ICD-10-CM | POA: Diagnosis not present

## 2021-12-12 DIAGNOSIS — J3089 Other allergic rhinitis: Secondary | ICD-10-CM | POA: Diagnosis not present

## 2021-12-19 DIAGNOSIS — J3081 Allergic rhinitis due to animal (cat) (dog) hair and dander: Secondary | ICD-10-CM | POA: Diagnosis not present

## 2021-12-19 DIAGNOSIS — J301 Allergic rhinitis due to pollen: Secondary | ICD-10-CM | POA: Diagnosis not present

## 2021-12-19 DIAGNOSIS — J3089 Other allergic rhinitis: Secondary | ICD-10-CM | POA: Diagnosis not present

## 2021-12-20 DIAGNOSIS — J3089 Other allergic rhinitis: Secondary | ICD-10-CM | POA: Diagnosis not present

## 2021-12-20 DIAGNOSIS — J3081 Allergic rhinitis due to animal (cat) (dog) hair and dander: Secondary | ICD-10-CM | POA: Diagnosis not present

## 2021-12-26 DIAGNOSIS — J3081 Allergic rhinitis due to animal (cat) (dog) hair and dander: Secondary | ICD-10-CM | POA: Diagnosis not present

## 2021-12-26 DIAGNOSIS — J3089 Other allergic rhinitis: Secondary | ICD-10-CM | POA: Diagnosis not present

## 2021-12-26 DIAGNOSIS — J301 Allergic rhinitis due to pollen: Secondary | ICD-10-CM | POA: Diagnosis not present

## 2021-12-31 DIAGNOSIS — I7300 Raynaud's syndrome without gangrene: Secondary | ICD-10-CM | POA: Diagnosis not present

## 2021-12-31 DIAGNOSIS — R197 Diarrhea, unspecified: Secondary | ICD-10-CM | POA: Diagnosis not present

## 2021-12-31 DIAGNOSIS — I10 Essential (primary) hypertension: Secondary | ICD-10-CM | POA: Diagnosis not present

## 2021-12-31 DIAGNOSIS — R600 Localized edema: Secondary | ICD-10-CM | POA: Diagnosis not present

## 2022-01-02 DIAGNOSIS — J301 Allergic rhinitis due to pollen: Secondary | ICD-10-CM | POA: Diagnosis not present

## 2022-01-02 DIAGNOSIS — J3081 Allergic rhinitis due to animal (cat) (dog) hair and dander: Secondary | ICD-10-CM | POA: Diagnosis not present

## 2022-01-02 DIAGNOSIS — J3089 Other allergic rhinitis: Secondary | ICD-10-CM | POA: Diagnosis not present

## 2022-01-09 DIAGNOSIS — J301 Allergic rhinitis due to pollen: Secondary | ICD-10-CM | POA: Diagnosis not present

## 2022-01-09 DIAGNOSIS — J3081 Allergic rhinitis due to animal (cat) (dog) hair and dander: Secondary | ICD-10-CM | POA: Diagnosis not present

## 2022-01-09 DIAGNOSIS — J3089 Other allergic rhinitis: Secondary | ICD-10-CM | POA: Diagnosis not present

## 2022-01-16 DIAGNOSIS — J301 Allergic rhinitis due to pollen: Secondary | ICD-10-CM | POA: Diagnosis not present

## 2022-01-16 DIAGNOSIS — J3081 Allergic rhinitis due to animal (cat) (dog) hair and dander: Secondary | ICD-10-CM | POA: Diagnosis not present

## 2022-01-16 DIAGNOSIS — J3089 Other allergic rhinitis: Secondary | ICD-10-CM | POA: Diagnosis not present

## 2022-01-18 DIAGNOSIS — G4733 Obstructive sleep apnea (adult) (pediatric): Secondary | ICD-10-CM | POA: Diagnosis not present

## 2022-01-23 DIAGNOSIS — J3089 Other allergic rhinitis: Secondary | ICD-10-CM | POA: Diagnosis not present

## 2022-01-23 DIAGNOSIS — J301 Allergic rhinitis due to pollen: Secondary | ICD-10-CM | POA: Diagnosis not present

## 2022-01-23 DIAGNOSIS — J3081 Allergic rhinitis due to animal (cat) (dog) hair and dander: Secondary | ICD-10-CM | POA: Diagnosis not present

## 2022-01-30 DIAGNOSIS — J3081 Allergic rhinitis due to animal (cat) (dog) hair and dander: Secondary | ICD-10-CM | POA: Diagnosis not present

## 2022-01-30 DIAGNOSIS — J301 Allergic rhinitis due to pollen: Secondary | ICD-10-CM | POA: Diagnosis not present

## 2022-01-30 DIAGNOSIS — J3089 Other allergic rhinitis: Secondary | ICD-10-CM | POA: Diagnosis not present

## 2022-02-06 DIAGNOSIS — J301 Allergic rhinitis due to pollen: Secondary | ICD-10-CM | POA: Diagnosis not present

## 2022-02-06 DIAGNOSIS — J3089 Other allergic rhinitis: Secondary | ICD-10-CM | POA: Diagnosis not present

## 2022-02-06 DIAGNOSIS — J3081 Allergic rhinitis due to animal (cat) (dog) hair and dander: Secondary | ICD-10-CM | POA: Diagnosis not present

## 2022-02-13 DIAGNOSIS — J3081 Allergic rhinitis due to animal (cat) (dog) hair and dander: Secondary | ICD-10-CM | POA: Diagnosis not present

## 2022-02-13 DIAGNOSIS — J3089 Other allergic rhinitis: Secondary | ICD-10-CM | POA: Diagnosis not present

## 2022-02-13 DIAGNOSIS — J301 Allergic rhinitis due to pollen: Secondary | ICD-10-CM | POA: Diagnosis not present

## 2022-02-20 DIAGNOSIS — J3089 Other allergic rhinitis: Secondary | ICD-10-CM | POA: Diagnosis not present

## 2022-02-20 DIAGNOSIS — J301 Allergic rhinitis due to pollen: Secondary | ICD-10-CM | POA: Diagnosis not present

## 2022-02-20 DIAGNOSIS — J3081 Allergic rhinitis due to animal (cat) (dog) hair and dander: Secondary | ICD-10-CM | POA: Diagnosis not present

## 2022-02-27 DIAGNOSIS — J3089 Other allergic rhinitis: Secondary | ICD-10-CM | POA: Diagnosis not present

## 2022-02-27 DIAGNOSIS — J3081 Allergic rhinitis due to animal (cat) (dog) hair and dander: Secondary | ICD-10-CM | POA: Diagnosis not present

## 2022-02-27 DIAGNOSIS — J301 Allergic rhinitis due to pollen: Secondary | ICD-10-CM | POA: Diagnosis not present

## 2022-03-06 DIAGNOSIS — J3081 Allergic rhinitis due to animal (cat) (dog) hair and dander: Secondary | ICD-10-CM | POA: Diagnosis not present

## 2022-03-06 DIAGNOSIS — J301 Allergic rhinitis due to pollen: Secondary | ICD-10-CM | POA: Diagnosis not present

## 2022-03-06 DIAGNOSIS — J3089 Other allergic rhinitis: Secondary | ICD-10-CM | POA: Diagnosis not present

## 2022-03-12 DIAGNOSIS — E785 Hyperlipidemia, unspecified: Secondary | ICD-10-CM | POA: Diagnosis not present

## 2022-03-12 DIAGNOSIS — E1159 Type 2 diabetes mellitus with other circulatory complications: Secondary | ICD-10-CM | POA: Diagnosis not present

## 2022-03-12 DIAGNOSIS — I10 Essential (primary) hypertension: Secondary | ICD-10-CM | POA: Diagnosis not present

## 2022-03-13 DIAGNOSIS — J301 Allergic rhinitis due to pollen: Secondary | ICD-10-CM | POA: Diagnosis not present

## 2022-03-13 DIAGNOSIS — J3089 Other allergic rhinitis: Secondary | ICD-10-CM | POA: Diagnosis not present

## 2022-03-13 DIAGNOSIS — J3081 Allergic rhinitis due to animal (cat) (dog) hair and dander: Secondary | ICD-10-CM | POA: Diagnosis not present

## 2022-03-18 DIAGNOSIS — R634 Abnormal weight loss: Secondary | ICD-10-CM | POA: Diagnosis not present

## 2022-03-18 DIAGNOSIS — J3089 Other allergic rhinitis: Secondary | ICD-10-CM | POA: Diagnosis not present

## 2022-03-18 DIAGNOSIS — E785 Hyperlipidemia, unspecified: Secondary | ICD-10-CM | POA: Diagnosis not present

## 2022-03-18 DIAGNOSIS — I10 Essential (primary) hypertension: Secondary | ICD-10-CM | POA: Diagnosis not present

## 2022-03-18 DIAGNOSIS — E1159 Type 2 diabetes mellitus with other circulatory complications: Secondary | ICD-10-CM | POA: Diagnosis not present

## 2022-03-19 DIAGNOSIS — Z6821 Body mass index (BMI) 21.0-21.9, adult: Secondary | ICD-10-CM | POA: Diagnosis not present

## 2022-03-19 DIAGNOSIS — Z9884 Bariatric surgery status: Secondary | ICD-10-CM | POA: Diagnosis not present

## 2022-03-19 DIAGNOSIS — Z713 Dietary counseling and surveillance: Secondary | ICD-10-CM | POA: Diagnosis not present

## 2022-03-19 DIAGNOSIS — R634 Abnormal weight loss: Secondary | ICD-10-CM | POA: Diagnosis not present

## 2022-03-19 DIAGNOSIS — R110 Nausea: Secondary | ICD-10-CM | POA: Diagnosis not present

## 2022-03-20 DIAGNOSIS — J3089 Other allergic rhinitis: Secondary | ICD-10-CM | POA: Diagnosis not present

## 2022-03-20 DIAGNOSIS — J301 Allergic rhinitis due to pollen: Secondary | ICD-10-CM | POA: Diagnosis not present

## 2022-03-20 DIAGNOSIS — J3081 Allergic rhinitis due to animal (cat) (dog) hair and dander: Secondary | ICD-10-CM | POA: Diagnosis not present

## 2022-03-27 DIAGNOSIS — J3089 Other allergic rhinitis: Secondary | ICD-10-CM | POA: Diagnosis not present

## 2022-03-27 DIAGNOSIS — J3081 Allergic rhinitis due to animal (cat) (dog) hair and dander: Secondary | ICD-10-CM | POA: Diagnosis not present

## 2022-03-27 DIAGNOSIS — J301 Allergic rhinitis due to pollen: Secondary | ICD-10-CM | POA: Diagnosis not present

## 2022-04-03 DIAGNOSIS — J3089 Other allergic rhinitis: Secondary | ICD-10-CM | POA: Diagnosis not present

## 2022-04-03 DIAGNOSIS — J301 Allergic rhinitis due to pollen: Secondary | ICD-10-CM | POA: Diagnosis not present

## 2022-04-03 DIAGNOSIS — J3081 Allergic rhinitis due to animal (cat) (dog) hair and dander: Secondary | ICD-10-CM | POA: Diagnosis not present

## 2022-04-08 DIAGNOSIS — R634 Abnormal weight loss: Secondary | ICD-10-CM | POA: Diagnosis not present

## 2022-04-08 DIAGNOSIS — K5989 Other specified functional intestinal disorders: Secondary | ICD-10-CM | POA: Diagnosis not present

## 2022-04-08 DIAGNOSIS — Z9884 Bariatric surgery status: Secondary | ICD-10-CM | POA: Diagnosis not present

## 2022-04-08 DIAGNOSIS — K449 Diaphragmatic hernia without obstruction or gangrene: Secondary | ICD-10-CM | POA: Diagnosis not present

## 2022-04-10 DIAGNOSIS — J3081 Allergic rhinitis due to animal (cat) (dog) hair and dander: Secondary | ICD-10-CM | POA: Diagnosis not present

## 2022-04-10 DIAGNOSIS — J301 Allergic rhinitis due to pollen: Secondary | ICD-10-CM | POA: Diagnosis not present

## 2022-04-10 DIAGNOSIS — J3089 Other allergic rhinitis: Secondary | ICD-10-CM | POA: Diagnosis not present

## 2022-04-17 DIAGNOSIS — J301 Allergic rhinitis due to pollen: Secondary | ICD-10-CM | POA: Diagnosis not present

## 2022-04-17 DIAGNOSIS — J3081 Allergic rhinitis due to animal (cat) (dog) hair and dander: Secondary | ICD-10-CM | POA: Diagnosis not present

## 2022-04-17 DIAGNOSIS — J3089 Other allergic rhinitis: Secondary | ICD-10-CM | POA: Diagnosis not present

## 2022-04-19 DIAGNOSIS — G4733 Obstructive sleep apnea (adult) (pediatric): Secondary | ICD-10-CM | POA: Diagnosis not present

## 2022-04-24 DIAGNOSIS — J3081 Allergic rhinitis due to animal (cat) (dog) hair and dander: Secondary | ICD-10-CM | POA: Diagnosis not present

## 2022-04-24 DIAGNOSIS — J301 Allergic rhinitis due to pollen: Secondary | ICD-10-CM | POA: Diagnosis not present

## 2022-04-24 DIAGNOSIS — J3089 Other allergic rhinitis: Secondary | ICD-10-CM | POA: Diagnosis not present

## 2022-04-29 DIAGNOSIS — K080 Exfoliation of teeth due to systemic causes: Secondary | ICD-10-CM | POA: Diagnosis not present

## 2022-05-01 DIAGNOSIS — J3081 Allergic rhinitis due to animal (cat) (dog) hair and dander: Secondary | ICD-10-CM | POA: Diagnosis not present

## 2022-05-01 DIAGNOSIS — J3089 Other allergic rhinitis: Secondary | ICD-10-CM | POA: Diagnosis not present

## 2022-05-01 DIAGNOSIS — J301 Allergic rhinitis due to pollen: Secondary | ICD-10-CM | POA: Diagnosis not present

## 2022-05-08 DIAGNOSIS — J3089 Other allergic rhinitis: Secondary | ICD-10-CM | POA: Diagnosis not present

## 2022-05-08 DIAGNOSIS — J3081 Allergic rhinitis due to animal (cat) (dog) hair and dander: Secondary | ICD-10-CM | POA: Diagnosis not present

## 2022-05-08 DIAGNOSIS — J301 Allergic rhinitis due to pollen: Secondary | ICD-10-CM | POA: Diagnosis not present

## 2022-05-15 DIAGNOSIS — R110 Nausea: Secondary | ICD-10-CM | POA: Diagnosis not present

## 2022-05-15 DIAGNOSIS — J301 Allergic rhinitis due to pollen: Secondary | ICD-10-CM | POA: Diagnosis not present

## 2022-05-15 DIAGNOSIS — R634 Abnormal weight loss: Secondary | ICD-10-CM | POA: Diagnosis not present

## 2022-05-15 DIAGNOSIS — Z9884 Bariatric surgery status: Secondary | ICD-10-CM | POA: Diagnosis not present

## 2022-05-15 DIAGNOSIS — J3089 Other allergic rhinitis: Secondary | ICD-10-CM | POA: Diagnosis not present

## 2022-05-15 DIAGNOSIS — J3081 Allergic rhinitis due to animal (cat) (dog) hair and dander: Secondary | ICD-10-CM | POA: Diagnosis not present

## 2022-05-16 DIAGNOSIS — N952 Postmenopausal atrophic vaginitis: Secondary | ICD-10-CM | POA: Diagnosis not present

## 2022-05-16 DIAGNOSIS — E139 Other specified diabetes mellitus without complications: Secondary | ICD-10-CM | POA: Diagnosis not present

## 2022-05-16 DIAGNOSIS — B3732 Chronic candidiasis of vulva and vagina: Secondary | ICD-10-CM | POA: Diagnosis not present

## 2022-05-16 DIAGNOSIS — L723 Sebaceous cyst: Secondary | ICD-10-CM | POA: Diagnosis not present

## 2022-05-17 DIAGNOSIS — R634 Abnormal weight loss: Secondary | ICD-10-CM | POA: Diagnosis not present

## 2022-05-17 DIAGNOSIS — K760 Fatty (change of) liver, not elsewhere classified: Secondary | ICD-10-CM | POA: Diagnosis not present

## 2022-05-17 DIAGNOSIS — R110 Nausea: Secondary | ICD-10-CM | POA: Diagnosis not present

## 2022-05-22 DIAGNOSIS — J3081 Allergic rhinitis due to animal (cat) (dog) hair and dander: Secondary | ICD-10-CM | POA: Diagnosis not present

## 2022-05-22 DIAGNOSIS — J301 Allergic rhinitis due to pollen: Secondary | ICD-10-CM | POA: Diagnosis not present

## 2022-05-22 DIAGNOSIS — J3089 Other allergic rhinitis: Secondary | ICD-10-CM | POA: Diagnosis not present

## 2022-05-29 DIAGNOSIS — J3089 Other allergic rhinitis: Secondary | ICD-10-CM | POA: Diagnosis not present

## 2022-05-29 DIAGNOSIS — J3081 Allergic rhinitis due to animal (cat) (dog) hair and dander: Secondary | ICD-10-CM | POA: Diagnosis not present

## 2022-05-29 DIAGNOSIS — J301 Allergic rhinitis due to pollen: Secondary | ICD-10-CM | POA: Diagnosis not present

## 2022-05-30 DIAGNOSIS — R110 Nausea: Secondary | ICD-10-CM | POA: Diagnosis not present

## 2022-05-30 DIAGNOSIS — R634 Abnormal weight loss: Secondary | ICD-10-CM | POA: Diagnosis not present

## 2022-06-05 DIAGNOSIS — J301 Allergic rhinitis due to pollen: Secondary | ICD-10-CM | POA: Diagnosis not present

## 2022-06-05 DIAGNOSIS — J3089 Other allergic rhinitis: Secondary | ICD-10-CM | POA: Diagnosis not present

## 2022-06-05 DIAGNOSIS — J3081 Allergic rhinitis due to animal (cat) (dog) hair and dander: Secondary | ICD-10-CM | POA: Diagnosis not present

## 2022-06-10 DIAGNOSIS — R55 Syncope and collapse: Secondary | ICD-10-CM | POA: Diagnosis not present

## 2022-06-12 DIAGNOSIS — J301 Allergic rhinitis due to pollen: Secondary | ICD-10-CM | POA: Diagnosis not present

## 2022-06-12 DIAGNOSIS — J3081 Allergic rhinitis due to animal (cat) (dog) hair and dander: Secondary | ICD-10-CM | POA: Diagnosis not present

## 2022-06-12 DIAGNOSIS — J3089 Other allergic rhinitis: Secondary | ICD-10-CM | POA: Diagnosis not present

## 2022-06-13 DIAGNOSIS — K760 Fatty (change of) liver, not elsewhere classified: Secondary | ICD-10-CM | POA: Diagnosis not present

## 2022-06-13 DIAGNOSIS — Z6820 Body mass index (BMI) 20.0-20.9, adult: Secondary | ICD-10-CM | POA: Diagnosis not present

## 2022-06-13 DIAGNOSIS — R55 Syncope and collapse: Secondary | ICD-10-CM | POA: Diagnosis not present

## 2022-06-15 IMAGING — CR DG HIP (WITH OR WITHOUT PELVIS) 2-3V*L*
2 series · 2 of 2 positions shown · non-contrast
Comparison: None.

CLINICAL DATA: Left hip pain.

EXAM:
DG HIP (WITH OR WITHOUT PELVIS) 2-3V LEFT

[w hip ap left]
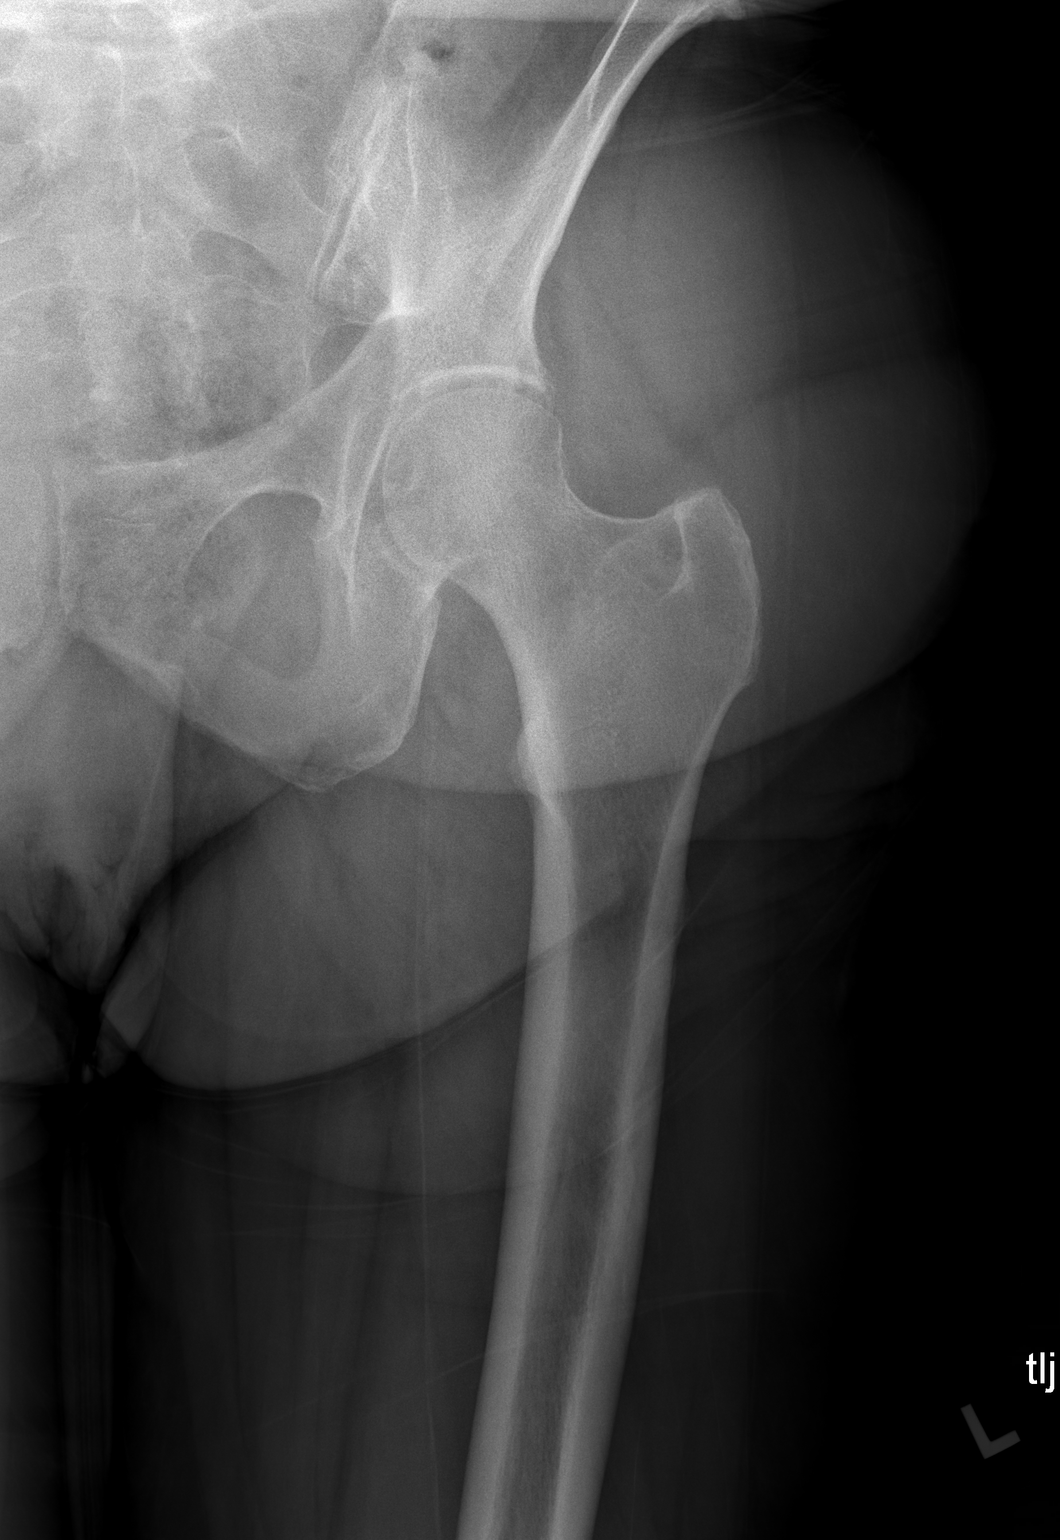

[w hip lat left]
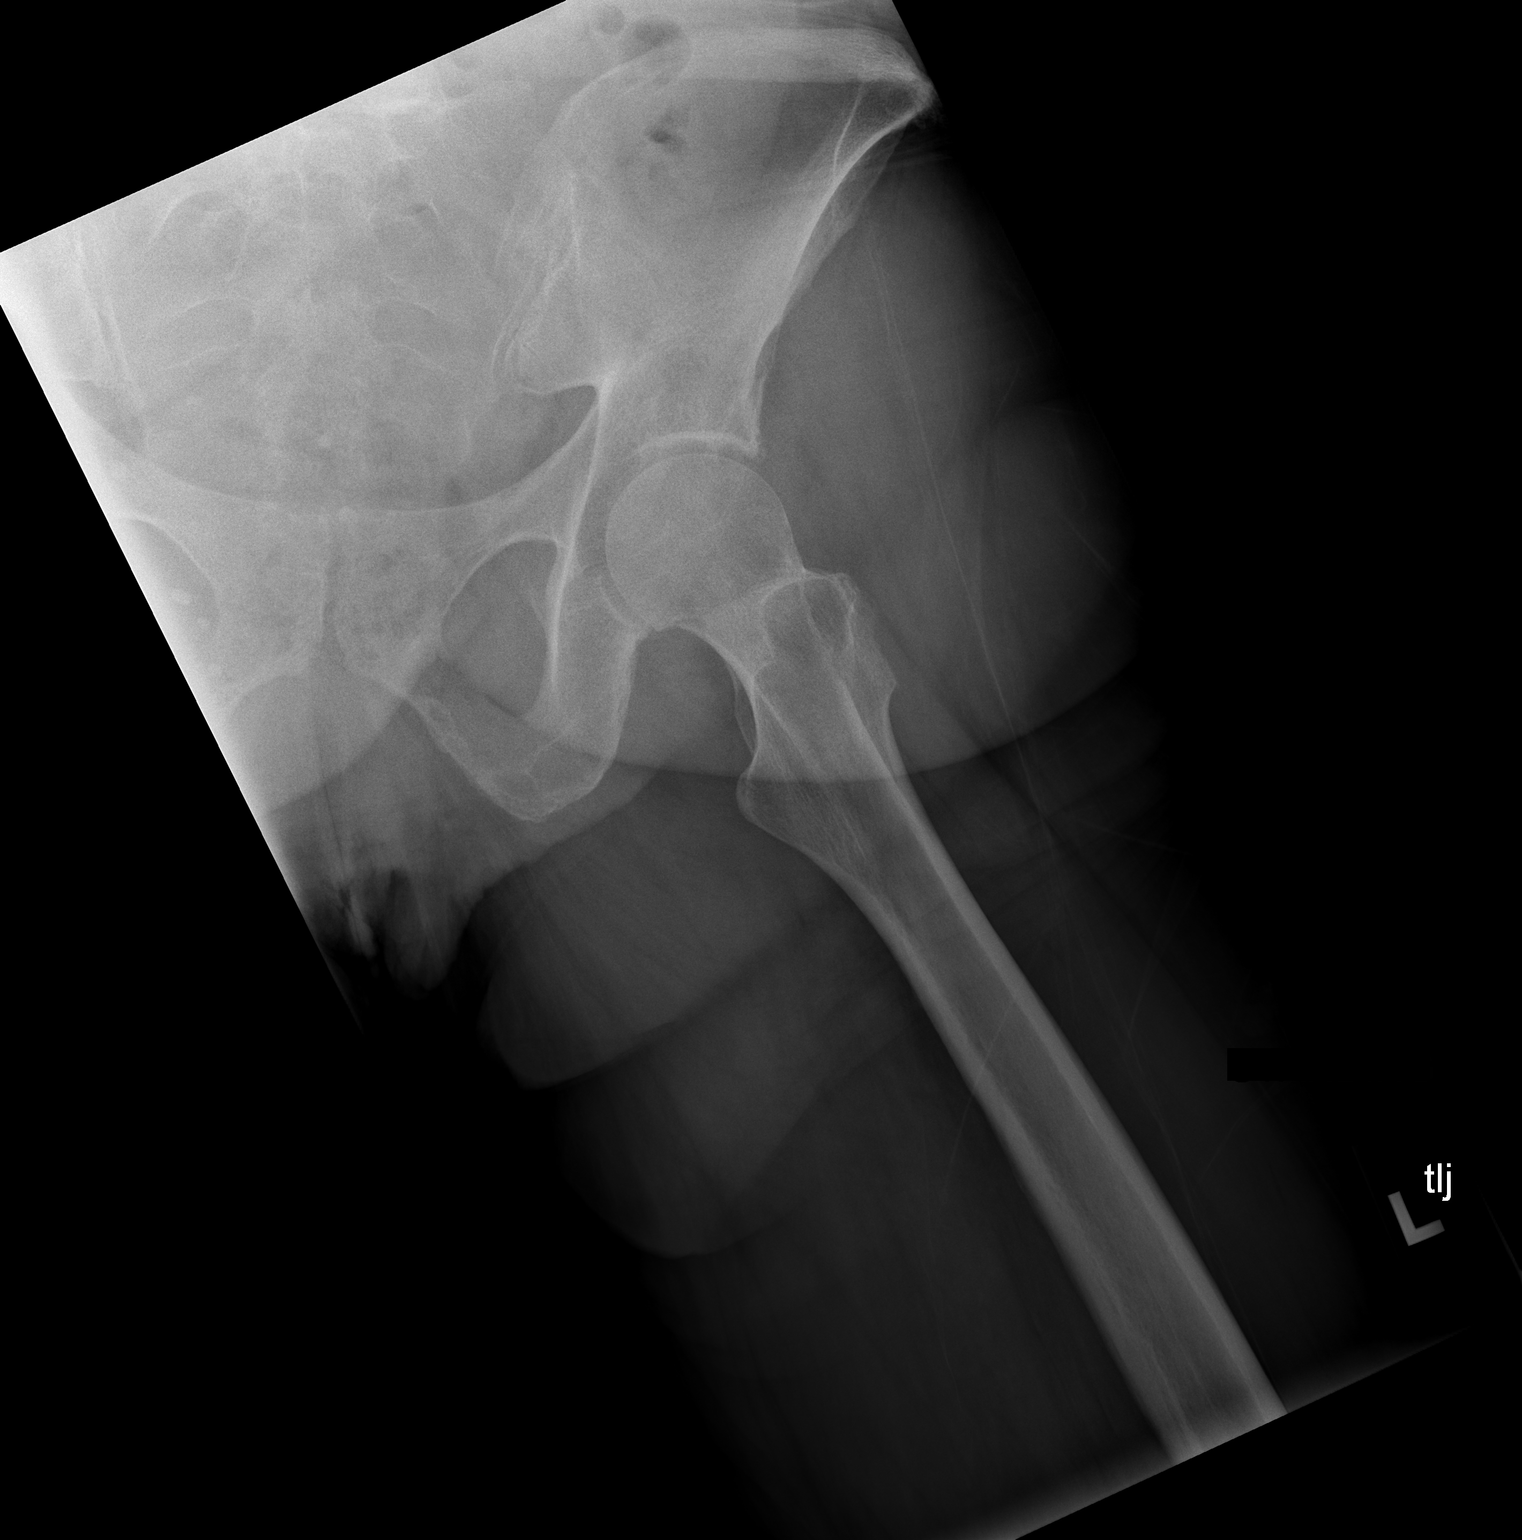

[2 of 2 positions shown; findings below may reference images not displayed]

FINDINGS: There is no evidence of hip fracture or dislocation. There is no
evidence of arthropathy or other focal bone abnormality.
IMPRESSION: Negative.

## 2022-06-19 DIAGNOSIS — J3081 Allergic rhinitis due to animal (cat) (dog) hair and dander: Secondary | ICD-10-CM | POA: Diagnosis not present

## 2022-06-19 DIAGNOSIS — J3089 Other allergic rhinitis: Secondary | ICD-10-CM | POA: Diagnosis not present

## 2022-06-19 DIAGNOSIS — J301 Allergic rhinitis due to pollen: Secondary | ICD-10-CM | POA: Diagnosis not present

## 2022-06-28 DIAGNOSIS — J3089 Other allergic rhinitis: Secondary | ICD-10-CM | POA: Diagnosis not present

## 2022-06-28 DIAGNOSIS — J301 Allergic rhinitis due to pollen: Secondary | ICD-10-CM | POA: Diagnosis not present

## 2022-06-28 DIAGNOSIS — J3081 Allergic rhinitis due to animal (cat) (dog) hair and dander: Secondary | ICD-10-CM | POA: Diagnosis not present

## 2022-07-02 DIAGNOSIS — E43 Unspecified severe protein-calorie malnutrition: Secondary | ICD-10-CM | POA: Diagnosis not present

## 2022-07-02 DIAGNOSIS — R634 Abnormal weight loss: Secondary | ICD-10-CM | POA: Diagnosis not present

## 2022-07-02 DIAGNOSIS — R110 Nausea: Secondary | ICD-10-CM | POA: Diagnosis not present

## 2022-07-02 DIAGNOSIS — Z713 Dietary counseling and surveillance: Secondary | ICD-10-CM | POA: Diagnosis not present

## 2022-07-02 DIAGNOSIS — Z9884 Bariatric surgery status: Secondary | ICD-10-CM | POA: Diagnosis not present

## 2022-07-03 DIAGNOSIS — J3081 Allergic rhinitis due to animal (cat) (dog) hair and dander: Secondary | ICD-10-CM | POA: Diagnosis not present

## 2022-07-03 DIAGNOSIS — J301 Allergic rhinitis due to pollen: Secondary | ICD-10-CM | POA: Diagnosis not present

## 2022-07-03 DIAGNOSIS — J3089 Other allergic rhinitis: Secondary | ICD-10-CM | POA: Diagnosis not present

## 2022-07-17 DIAGNOSIS — Z9884 Bariatric surgery status: Secondary | ICD-10-CM | POA: Diagnosis not present

## 2022-07-17 DIAGNOSIS — J3089 Other allergic rhinitis: Secondary | ICD-10-CM | POA: Diagnosis not present

## 2022-07-17 DIAGNOSIS — R634 Abnormal weight loss: Secondary | ICD-10-CM | POA: Diagnosis not present

## 2022-07-17 DIAGNOSIS — J3081 Allergic rhinitis due to animal (cat) (dog) hair and dander: Secondary | ICD-10-CM | POA: Diagnosis not present

## 2022-07-17 DIAGNOSIS — J301 Allergic rhinitis due to pollen: Secondary | ICD-10-CM | POA: Diagnosis not present

## 2022-07-22 DIAGNOSIS — I10 Essential (primary) hypertension: Secondary | ICD-10-CM | POA: Diagnosis not present

## 2022-07-22 DIAGNOSIS — J3081 Allergic rhinitis due to animal (cat) (dog) hair and dander: Secondary | ICD-10-CM | POA: Diagnosis not present

## 2022-07-22 DIAGNOSIS — E441 Mild protein-calorie malnutrition: Secondary | ICD-10-CM | POA: Diagnosis not present

## 2022-07-22 DIAGNOSIS — I7300 Raynaud's syndrome without gangrene: Secondary | ICD-10-CM | POA: Diagnosis not present

## 2022-07-22 DIAGNOSIS — J301 Allergic rhinitis due to pollen: Secondary | ICD-10-CM | POA: Diagnosis not present

## 2022-07-22 DIAGNOSIS — Z6820 Body mass index (BMI) 20.0-20.9, adult: Secondary | ICD-10-CM | POA: Diagnosis not present

## 2022-07-22 DIAGNOSIS — J3089 Other allergic rhinitis: Secondary | ICD-10-CM | POA: Diagnosis not present

## 2022-07-23 DIAGNOSIS — G4733 Obstructive sleep apnea (adult) (pediatric): Secondary | ICD-10-CM | POA: Diagnosis not present

## 2022-07-29 DIAGNOSIS — J3081 Allergic rhinitis due to animal (cat) (dog) hair and dander: Secondary | ICD-10-CM | POA: Diagnosis not present

## 2022-07-29 DIAGNOSIS — J3089 Other allergic rhinitis: Secondary | ICD-10-CM | POA: Diagnosis not present

## 2022-07-29 DIAGNOSIS — J301 Allergic rhinitis due to pollen: Secondary | ICD-10-CM | POA: Diagnosis not present

## 2022-08-07 DIAGNOSIS — J301 Allergic rhinitis due to pollen: Secondary | ICD-10-CM | POA: Diagnosis not present

## 2022-08-07 DIAGNOSIS — J3089 Other allergic rhinitis: Secondary | ICD-10-CM | POA: Diagnosis not present

## 2022-08-07 DIAGNOSIS — J3081 Allergic rhinitis due to animal (cat) (dog) hair and dander: Secondary | ICD-10-CM | POA: Diagnosis not present

## 2022-08-14 DIAGNOSIS — J301 Allergic rhinitis due to pollen: Secondary | ICD-10-CM | POA: Diagnosis not present

## 2022-08-14 DIAGNOSIS — N952 Postmenopausal atrophic vaginitis: Secondary | ICD-10-CM | POA: Diagnosis not present

## 2022-08-14 DIAGNOSIS — J3089 Other allergic rhinitis: Secondary | ICD-10-CM | POA: Diagnosis not present

## 2022-08-14 DIAGNOSIS — J3081 Allergic rhinitis due to animal (cat) (dog) hair and dander: Secondary | ICD-10-CM | POA: Diagnosis not present

## 2022-08-19 DIAGNOSIS — R142 Eructation: Secondary | ICD-10-CM | POA: Diagnosis not present

## 2022-08-19 DIAGNOSIS — R634 Abnormal weight loss: Secondary | ICD-10-CM | POA: Diagnosis not present

## 2022-08-19 DIAGNOSIS — R110 Nausea: Secondary | ICD-10-CM | POA: Diagnosis not present

## 2022-08-19 DIAGNOSIS — Z9884 Bariatric surgery status: Secondary | ICD-10-CM | POA: Diagnosis not present

## 2022-08-20 DIAGNOSIS — R110 Nausea: Secondary | ICD-10-CM | POA: Diagnosis not present

## 2022-08-20 DIAGNOSIS — R142 Eructation: Secondary | ICD-10-CM | POA: Diagnosis not present

## 2022-08-20 DIAGNOSIS — R634 Abnormal weight loss: Secondary | ICD-10-CM | POA: Diagnosis not present

## 2022-08-20 DIAGNOSIS — Z9884 Bariatric surgery status: Secondary | ICD-10-CM | POA: Diagnosis not present

## 2022-08-20 DIAGNOSIS — K2289 Other specified disease of esophagus: Secondary | ICD-10-CM | POA: Diagnosis not present

## 2022-08-21 DIAGNOSIS — J3089 Other allergic rhinitis: Secondary | ICD-10-CM | POA: Diagnosis not present

## 2022-08-21 DIAGNOSIS — J3081 Allergic rhinitis due to animal (cat) (dog) hair and dander: Secondary | ICD-10-CM | POA: Diagnosis not present

## 2022-08-21 DIAGNOSIS — R110 Nausea: Secondary | ICD-10-CM | POA: Diagnosis not present

## 2022-08-21 DIAGNOSIS — R142 Eructation: Secondary | ICD-10-CM | POA: Diagnosis not present

## 2022-08-21 DIAGNOSIS — J301 Allergic rhinitis due to pollen: Secondary | ICD-10-CM | POA: Diagnosis not present

## 2022-08-23 DIAGNOSIS — Z1231 Encounter for screening mammogram for malignant neoplasm of breast: Secondary | ICD-10-CM | POA: Diagnosis not present

## 2022-08-28 DIAGNOSIS — R634 Abnormal weight loss: Secondary | ICD-10-CM | POA: Diagnosis not present

## 2022-08-28 DIAGNOSIS — K224 Dyskinesia of esophagus: Secondary | ICD-10-CM | POA: Diagnosis not present

## 2022-08-28 DIAGNOSIS — J3081 Allergic rhinitis due to animal (cat) (dog) hair and dander: Secondary | ICD-10-CM | POA: Diagnosis not present

## 2022-08-28 DIAGNOSIS — J301 Allergic rhinitis due to pollen: Secondary | ICD-10-CM | POA: Diagnosis not present

## 2022-08-28 DIAGNOSIS — J3089 Other allergic rhinitis: Secondary | ICD-10-CM | POA: Diagnosis not present

## 2022-08-28 DIAGNOSIS — Z9884 Bariatric surgery status: Secondary | ICD-10-CM | POA: Diagnosis not present

## 2022-09-04 DIAGNOSIS — J3081 Allergic rhinitis due to animal (cat) (dog) hair and dander: Secondary | ICD-10-CM | POA: Diagnosis not present

## 2022-09-04 DIAGNOSIS — J301 Allergic rhinitis due to pollen: Secondary | ICD-10-CM | POA: Diagnosis not present

## 2022-09-04 DIAGNOSIS — J3089 Other allergic rhinitis: Secondary | ICD-10-CM | POA: Diagnosis not present

## 2022-09-11 DIAGNOSIS — J301 Allergic rhinitis due to pollen: Secondary | ICD-10-CM | POA: Diagnosis not present

## 2022-09-11 DIAGNOSIS — J3089 Other allergic rhinitis: Secondary | ICD-10-CM | POA: Diagnosis not present

## 2022-09-11 DIAGNOSIS — J3081 Allergic rhinitis due to animal (cat) (dog) hair and dander: Secondary | ICD-10-CM | POA: Diagnosis not present

## 2022-09-18 DIAGNOSIS — J3081 Allergic rhinitis due to animal (cat) (dog) hair and dander: Secondary | ICD-10-CM | POA: Diagnosis not present

## 2022-09-18 DIAGNOSIS — J301 Allergic rhinitis due to pollen: Secondary | ICD-10-CM | POA: Diagnosis not present

## 2022-09-18 DIAGNOSIS — J3089 Other allergic rhinitis: Secondary | ICD-10-CM | POA: Diagnosis not present

## 2022-09-19 DIAGNOSIS — K9189 Other postprocedural complications and disorders of digestive system: Secondary | ICD-10-CM | POA: Diagnosis not present

## 2022-09-19 DIAGNOSIS — G4733 Obstructive sleep apnea (adult) (pediatric): Secondary | ICD-10-CM | POA: Diagnosis not present

## 2022-09-19 DIAGNOSIS — K9589 Other complications of other bariatric procedure: Secondary | ICD-10-CM | POA: Diagnosis not present

## 2022-09-19 DIAGNOSIS — K2950 Unspecified chronic gastritis without bleeding: Secondary | ICD-10-CM | POA: Diagnosis not present

## 2022-09-19 DIAGNOSIS — K224 Dyskinesia of esophagus: Secondary | ICD-10-CM | POA: Diagnosis not present

## 2022-09-19 DIAGNOSIS — E119 Type 2 diabetes mellitus without complications: Secondary | ICD-10-CM | POA: Diagnosis not present

## 2022-09-19 DIAGNOSIS — I10 Essential (primary) hypertension: Secondary | ICD-10-CM | POA: Diagnosis not present

## 2022-09-19 DIAGNOSIS — Z79899 Other long term (current) drug therapy: Secondary | ICD-10-CM | POA: Diagnosis not present

## 2022-09-19 DIAGNOSIS — Z9884 Bariatric surgery status: Secondary | ICD-10-CM | POA: Diagnosis not present

## 2022-09-19 DIAGNOSIS — M1990 Unspecified osteoarthritis, unspecified site: Secondary | ICD-10-CM | POA: Diagnosis not present

## 2022-09-25 DIAGNOSIS — J3081 Allergic rhinitis due to animal (cat) (dog) hair and dander: Secondary | ICD-10-CM | POA: Diagnosis not present

## 2022-09-25 DIAGNOSIS — J3089 Other allergic rhinitis: Secondary | ICD-10-CM | POA: Diagnosis not present

## 2022-09-25 DIAGNOSIS — J301 Allergic rhinitis due to pollen: Secondary | ICD-10-CM | POA: Diagnosis not present

## 2022-10-02 DIAGNOSIS — J3081 Allergic rhinitis due to animal (cat) (dog) hair and dander: Secondary | ICD-10-CM | POA: Diagnosis not present

## 2022-10-02 DIAGNOSIS — J301 Allergic rhinitis due to pollen: Secondary | ICD-10-CM | POA: Diagnosis not present

## 2022-10-02 DIAGNOSIS — J3089 Other allergic rhinitis: Secondary | ICD-10-CM | POA: Diagnosis not present

## 2022-10-03 DIAGNOSIS — E1159 Type 2 diabetes mellitus with other circulatory complications: Secondary | ICD-10-CM | POA: Diagnosis not present

## 2022-10-07 DIAGNOSIS — I10 Essential (primary) hypertension: Secondary | ICD-10-CM | POA: Diagnosis not present

## 2022-10-07 DIAGNOSIS — E441 Mild protein-calorie malnutrition: Secondary | ICD-10-CM | POA: Diagnosis not present

## 2022-10-07 DIAGNOSIS — E1159 Type 2 diabetes mellitus with other circulatory complications: Secondary | ICD-10-CM | POA: Diagnosis not present

## 2022-10-07 DIAGNOSIS — Z0000 Encounter for general adult medical examination without abnormal findings: Secondary | ICD-10-CM | POA: Diagnosis not present

## 2022-10-07 DIAGNOSIS — K311 Adult hypertrophic pyloric stenosis: Secondary | ICD-10-CM | POA: Diagnosis not present

## 2022-10-09 DIAGNOSIS — J3081 Allergic rhinitis due to animal (cat) (dog) hair and dander: Secondary | ICD-10-CM | POA: Diagnosis not present

## 2022-10-09 DIAGNOSIS — J3089 Other allergic rhinitis: Secondary | ICD-10-CM | POA: Diagnosis not present

## 2022-10-09 DIAGNOSIS — J301 Allergic rhinitis due to pollen: Secondary | ICD-10-CM | POA: Diagnosis not present

## 2022-11-05 DIAGNOSIS — J301 Allergic rhinitis due to pollen: Secondary | ICD-10-CM | POA: Diagnosis not present

## 2022-11-05 DIAGNOSIS — J3089 Other allergic rhinitis: Secondary | ICD-10-CM | POA: Diagnosis not present

## 2022-11-05 DIAGNOSIS — J3081 Allergic rhinitis due to animal (cat) (dog) hair and dander: Secondary | ICD-10-CM | POA: Diagnosis not present

## 2022-11-05 DIAGNOSIS — H1045 Other chronic allergic conjunctivitis: Secondary | ICD-10-CM | POA: Diagnosis not present

## 2022-11-12 DIAGNOSIS — E349 Endocrine disorder, unspecified: Secondary | ICD-10-CM | POA: Diagnosis not present

## 2022-11-12 DIAGNOSIS — J3089 Other allergic rhinitis: Secondary | ICD-10-CM | POA: Diagnosis not present

## 2022-11-12 DIAGNOSIS — M8588 Other specified disorders of bone density and structure, other site: Secondary | ICD-10-CM | POA: Diagnosis not present

## 2022-11-12 DIAGNOSIS — J301 Allergic rhinitis due to pollen: Secondary | ICD-10-CM | POA: Diagnosis not present

## 2022-11-12 DIAGNOSIS — J3081 Allergic rhinitis due to animal (cat) (dog) hair and dander: Secondary | ICD-10-CM | POA: Diagnosis not present

## 2022-11-13 DIAGNOSIS — G4733 Obstructive sleep apnea (adult) (pediatric): Secondary | ICD-10-CM | POA: Diagnosis not present

## 2022-11-14 DIAGNOSIS — N952 Postmenopausal atrophic vaginitis: Secondary | ICD-10-CM | POA: Diagnosis not present

## 2022-11-19 DIAGNOSIS — J301 Allergic rhinitis due to pollen: Secondary | ICD-10-CM | POA: Diagnosis not present

## 2022-11-19 DIAGNOSIS — J3081 Allergic rhinitis due to animal (cat) (dog) hair and dander: Secondary | ICD-10-CM | POA: Diagnosis not present

## 2022-11-19 DIAGNOSIS — J3089 Other allergic rhinitis: Secondary | ICD-10-CM | POA: Diagnosis not present

## 2022-12-04 DIAGNOSIS — J3081 Allergic rhinitis due to animal (cat) (dog) hair and dander: Secondary | ICD-10-CM | POA: Diagnosis not present

## 2022-12-04 DIAGNOSIS — J3089 Other allergic rhinitis: Secondary | ICD-10-CM | POA: Diagnosis not present

## 2022-12-04 DIAGNOSIS — J301 Allergic rhinitis due to pollen: Secondary | ICD-10-CM | POA: Diagnosis not present

## 2022-12-11 DIAGNOSIS — J3089 Other allergic rhinitis: Secondary | ICD-10-CM | POA: Diagnosis not present

## 2022-12-11 DIAGNOSIS — J301 Allergic rhinitis due to pollen: Secondary | ICD-10-CM | POA: Diagnosis not present

## 2022-12-11 DIAGNOSIS — J3081 Allergic rhinitis due to animal (cat) (dog) hair and dander: Secondary | ICD-10-CM | POA: Diagnosis not present

## 2022-12-18 DIAGNOSIS — J301 Allergic rhinitis due to pollen: Secondary | ICD-10-CM | POA: Diagnosis not present

## 2022-12-18 DIAGNOSIS — J3089 Other allergic rhinitis: Secondary | ICD-10-CM | POA: Diagnosis not present

## 2022-12-18 DIAGNOSIS — J3081 Allergic rhinitis due to animal (cat) (dog) hair and dander: Secondary | ICD-10-CM | POA: Diagnosis not present

## 2022-12-19 DIAGNOSIS — Z9884 Bariatric surgery status: Secondary | ICD-10-CM | POA: Diagnosis not present

## 2022-12-19 DIAGNOSIS — K5669 Other partial intestinal obstruction: Secondary | ICD-10-CM | POA: Diagnosis not present

## 2022-12-19 DIAGNOSIS — E119 Type 2 diabetes mellitus without complications: Secondary | ICD-10-CM | POA: Diagnosis not present

## 2022-12-19 DIAGNOSIS — K9189 Other postprocedural complications and disorders of digestive system: Secondary | ICD-10-CM | POA: Diagnosis not present

## 2022-12-19 DIAGNOSIS — T182XXA Foreign body in stomach, initial encounter: Secondary | ICD-10-CM | POA: Diagnosis not present

## 2022-12-19 DIAGNOSIS — K289 Gastrojejunal ulcer, unspecified as acute or chronic, without hemorrhage or perforation: Secondary | ICD-10-CM | POA: Diagnosis not present

## 2022-12-25 DIAGNOSIS — J3089 Other allergic rhinitis: Secondary | ICD-10-CM | POA: Diagnosis not present

## 2022-12-25 DIAGNOSIS — J3081 Allergic rhinitis due to animal (cat) (dog) hair and dander: Secondary | ICD-10-CM | POA: Diagnosis not present

## 2022-12-25 DIAGNOSIS — J301 Allergic rhinitis due to pollen: Secondary | ICD-10-CM | POA: Diagnosis not present

## 2023-01-01 DIAGNOSIS — J3089 Other allergic rhinitis: Secondary | ICD-10-CM | POA: Diagnosis not present

## 2023-01-01 DIAGNOSIS — J3081 Allergic rhinitis due to animal (cat) (dog) hair and dander: Secondary | ICD-10-CM | POA: Diagnosis not present

## 2023-01-01 DIAGNOSIS — J301 Allergic rhinitis due to pollen: Secondary | ICD-10-CM | POA: Diagnosis not present

## 2023-01-02 DIAGNOSIS — J3089 Other allergic rhinitis: Secondary | ICD-10-CM | POA: Diagnosis not present

## 2023-01-02 DIAGNOSIS — J3081 Allergic rhinitis due to animal (cat) (dog) hair and dander: Secondary | ICD-10-CM | POA: Diagnosis not present

## 2023-01-02 DIAGNOSIS — J301 Allergic rhinitis due to pollen: Secondary | ICD-10-CM | POA: Diagnosis not present

## 2023-01-08 DIAGNOSIS — G4700 Insomnia, unspecified: Secondary | ICD-10-CM | POA: Diagnosis not present

## 2023-01-08 DIAGNOSIS — G4733 Obstructive sleep apnea (adult) (pediatric): Secondary | ICD-10-CM | POA: Diagnosis not present

## 2023-01-08 DIAGNOSIS — I10 Essential (primary) hypertension: Secondary | ICD-10-CM | POA: Diagnosis not present

## 2023-01-15 DIAGNOSIS — J3089 Other allergic rhinitis: Secondary | ICD-10-CM | POA: Diagnosis not present

## 2023-01-15 DIAGNOSIS — J3081 Allergic rhinitis due to animal (cat) (dog) hair and dander: Secondary | ICD-10-CM | POA: Diagnosis not present

## 2023-01-15 DIAGNOSIS — J301 Allergic rhinitis due to pollen: Secondary | ICD-10-CM | POA: Diagnosis not present

## 2023-01-22 DIAGNOSIS — J3089 Other allergic rhinitis: Secondary | ICD-10-CM | POA: Diagnosis not present

## 2023-01-22 DIAGNOSIS — J3081 Allergic rhinitis due to animal (cat) (dog) hair and dander: Secondary | ICD-10-CM | POA: Diagnosis not present

## 2023-01-22 DIAGNOSIS — J301 Allergic rhinitis due to pollen: Secondary | ICD-10-CM | POA: Diagnosis not present

## 2023-01-29 DIAGNOSIS — J301 Allergic rhinitis due to pollen: Secondary | ICD-10-CM | POA: Diagnosis not present

## 2023-01-29 DIAGNOSIS — J3081 Allergic rhinitis due to animal (cat) (dog) hair and dander: Secondary | ICD-10-CM | POA: Diagnosis not present

## 2023-01-29 DIAGNOSIS — J3089 Other allergic rhinitis: Secondary | ICD-10-CM | POA: Diagnosis not present

## 2023-02-13 DIAGNOSIS — G4733 Obstructive sleep apnea (adult) (pediatric): Secondary | ICD-10-CM | POA: Diagnosis not present

## 2023-02-19 DIAGNOSIS — J301 Allergic rhinitis due to pollen: Secondary | ICD-10-CM | POA: Diagnosis not present

## 2023-02-19 DIAGNOSIS — J3081 Allergic rhinitis due to animal (cat) (dog) hair and dander: Secondary | ICD-10-CM | POA: Diagnosis not present

## 2023-02-19 DIAGNOSIS — J3089 Other allergic rhinitis: Secondary | ICD-10-CM | POA: Diagnosis not present

## 2023-02-26 DIAGNOSIS — J3081 Allergic rhinitis due to animal (cat) (dog) hair and dander: Secondary | ICD-10-CM | POA: Diagnosis not present

## 2023-02-26 DIAGNOSIS — J301 Allergic rhinitis due to pollen: Secondary | ICD-10-CM | POA: Diagnosis not present

## 2023-02-26 DIAGNOSIS — J3089 Other allergic rhinitis: Secondary | ICD-10-CM | POA: Diagnosis not present

## 2023-03-05 DIAGNOSIS — J3089 Other allergic rhinitis: Secondary | ICD-10-CM | POA: Diagnosis not present

## 2023-03-05 DIAGNOSIS — J301 Allergic rhinitis due to pollen: Secondary | ICD-10-CM | POA: Diagnosis not present

## 2023-03-05 DIAGNOSIS — D2372 Other benign neoplasm of skin of left lower limb, including hip: Secondary | ICD-10-CM | POA: Diagnosis not present

## 2023-03-05 DIAGNOSIS — J3081 Allergic rhinitis due to animal (cat) (dog) hair and dander: Secondary | ICD-10-CM | POA: Diagnosis not present

## 2023-03-05 DIAGNOSIS — L239 Allergic contact dermatitis, unspecified cause: Secondary | ICD-10-CM | POA: Diagnosis not present

## 2023-03-05 DIAGNOSIS — D2271 Melanocytic nevi of right lower limb, including hip: Secondary | ICD-10-CM | POA: Diagnosis not present

## 2023-03-05 DIAGNOSIS — L821 Other seborrheic keratosis: Secondary | ICD-10-CM | POA: Diagnosis not present

## 2023-03-12 DIAGNOSIS — J3089 Other allergic rhinitis: Secondary | ICD-10-CM | POA: Diagnosis not present

## 2023-03-12 DIAGNOSIS — J301 Allergic rhinitis due to pollen: Secondary | ICD-10-CM | POA: Diagnosis not present

## 2023-03-12 DIAGNOSIS — J3081 Allergic rhinitis due to animal (cat) (dog) hair and dander: Secondary | ICD-10-CM | POA: Diagnosis not present

## 2023-03-19 DIAGNOSIS — J3081 Allergic rhinitis due to animal (cat) (dog) hair and dander: Secondary | ICD-10-CM | POA: Diagnosis not present

## 2023-03-19 DIAGNOSIS — J301 Allergic rhinitis due to pollen: Secondary | ICD-10-CM | POA: Diagnosis not present

## 2023-03-19 DIAGNOSIS — J3089 Other allergic rhinitis: Secondary | ICD-10-CM | POA: Diagnosis not present

## 2023-03-25 DIAGNOSIS — J3089 Other allergic rhinitis: Secondary | ICD-10-CM | POA: Diagnosis not present

## 2023-03-25 DIAGNOSIS — J301 Allergic rhinitis due to pollen: Secondary | ICD-10-CM | POA: Diagnosis not present

## 2023-03-25 DIAGNOSIS — J3081 Allergic rhinitis due to animal (cat) (dog) hair and dander: Secondary | ICD-10-CM | POA: Diagnosis not present

## 2023-04-03 DIAGNOSIS — J301 Allergic rhinitis due to pollen: Secondary | ICD-10-CM | POA: Diagnosis not present

## 2023-04-03 DIAGNOSIS — J3081 Allergic rhinitis due to animal (cat) (dog) hair and dander: Secondary | ICD-10-CM | POA: Diagnosis not present

## 2023-04-03 DIAGNOSIS — J3089 Other allergic rhinitis: Secondary | ICD-10-CM | POA: Diagnosis not present

## 2023-04-08 DIAGNOSIS — K900 Celiac disease: Secondary | ICD-10-CM | POA: Diagnosis not present

## 2023-04-08 DIAGNOSIS — E1159 Type 2 diabetes mellitus with other circulatory complications: Secondary | ICD-10-CM | POA: Diagnosis not present

## 2023-04-09 DIAGNOSIS — J301 Allergic rhinitis due to pollen: Secondary | ICD-10-CM | POA: Diagnosis not present

## 2023-04-09 DIAGNOSIS — J3089 Other allergic rhinitis: Secondary | ICD-10-CM | POA: Diagnosis not present

## 2023-04-09 DIAGNOSIS — J3081 Allergic rhinitis due to animal (cat) (dog) hair and dander: Secondary | ICD-10-CM | POA: Diagnosis not present

## 2023-04-10 DIAGNOSIS — I10 Essential (primary) hypertension: Secondary | ICD-10-CM | POA: Diagnosis not present

## 2023-04-10 DIAGNOSIS — E1159 Type 2 diabetes mellitus with other circulatory complications: Secondary | ICD-10-CM | POA: Diagnosis not present

## 2023-04-10 DIAGNOSIS — E785 Hyperlipidemia, unspecified: Secondary | ICD-10-CM | POA: Diagnosis not present

## 2023-04-10 DIAGNOSIS — K257 Chronic gastric ulcer without hemorrhage or perforation: Secondary | ICD-10-CM | POA: Diagnosis not present

## 2023-04-16 DIAGNOSIS — J3089 Other allergic rhinitis: Secondary | ICD-10-CM | POA: Diagnosis not present

## 2023-04-16 DIAGNOSIS — J3081 Allergic rhinitis due to animal (cat) (dog) hair and dander: Secondary | ICD-10-CM | POA: Diagnosis not present

## 2023-04-16 DIAGNOSIS — J301 Allergic rhinitis due to pollen: Secondary | ICD-10-CM | POA: Diagnosis not present

## 2023-04-23 DIAGNOSIS — M50122 Cervical disc disorder at C5-C6 level with radiculopathy: Secondary | ICD-10-CM | POA: Diagnosis not present

## 2023-04-23 DIAGNOSIS — J3089 Other allergic rhinitis: Secondary | ICD-10-CM | POA: Diagnosis not present

## 2023-04-23 DIAGNOSIS — M9902 Segmental and somatic dysfunction of thoracic region: Secondary | ICD-10-CM | POA: Diagnosis not present

## 2023-04-23 DIAGNOSIS — M9901 Segmental and somatic dysfunction of cervical region: Secondary | ICD-10-CM | POA: Diagnosis not present

## 2023-04-23 DIAGNOSIS — J3081 Allergic rhinitis due to animal (cat) (dog) hair and dander: Secondary | ICD-10-CM | POA: Diagnosis not present

## 2023-04-23 DIAGNOSIS — J301 Allergic rhinitis due to pollen: Secondary | ICD-10-CM | POA: Diagnosis not present

## 2023-04-23 DIAGNOSIS — M5384 Other specified dorsopathies, thoracic region: Secondary | ICD-10-CM | POA: Diagnosis not present

## 2023-04-28 DIAGNOSIS — M50122 Cervical disc disorder at C5-C6 level with radiculopathy: Secondary | ICD-10-CM | POA: Diagnosis not present

## 2023-04-28 DIAGNOSIS — M9902 Segmental and somatic dysfunction of thoracic region: Secondary | ICD-10-CM | POA: Diagnosis not present

## 2023-04-28 DIAGNOSIS — M9901 Segmental and somatic dysfunction of cervical region: Secondary | ICD-10-CM | POA: Diagnosis not present

## 2023-04-28 DIAGNOSIS — M5384 Other specified dorsopathies, thoracic region: Secondary | ICD-10-CM | POA: Diagnosis not present

## 2023-04-30 DIAGNOSIS — J3089 Other allergic rhinitis: Secondary | ICD-10-CM | POA: Diagnosis not present

## 2023-04-30 DIAGNOSIS — I10 Essential (primary) hypertension: Secondary | ICD-10-CM | POA: Diagnosis not present

## 2023-04-30 DIAGNOSIS — G4733 Obstructive sleep apnea (adult) (pediatric): Secondary | ICD-10-CM | POA: Diagnosis not present

## 2023-04-30 DIAGNOSIS — J3081 Allergic rhinitis due to animal (cat) (dog) hair and dander: Secondary | ICD-10-CM | POA: Diagnosis not present

## 2023-04-30 DIAGNOSIS — G4700 Insomnia, unspecified: Secondary | ICD-10-CM | POA: Diagnosis not present

## 2023-04-30 DIAGNOSIS — J301 Allergic rhinitis due to pollen: Secondary | ICD-10-CM | POA: Diagnosis not present

## 2023-05-01 DIAGNOSIS — M5384 Other specified dorsopathies, thoracic region: Secondary | ICD-10-CM | POA: Diagnosis not present

## 2023-05-01 DIAGNOSIS — M9902 Segmental and somatic dysfunction of thoracic region: Secondary | ICD-10-CM | POA: Diagnosis not present

## 2023-05-01 DIAGNOSIS — M9901 Segmental and somatic dysfunction of cervical region: Secondary | ICD-10-CM | POA: Diagnosis not present

## 2023-05-01 DIAGNOSIS — M50122 Cervical disc disorder at C5-C6 level with radiculopathy: Secondary | ICD-10-CM | POA: Diagnosis not present

## 2023-05-05 DIAGNOSIS — M50122 Cervical disc disorder at C5-C6 level with radiculopathy: Secondary | ICD-10-CM | POA: Diagnosis not present

## 2023-05-05 DIAGNOSIS — M9901 Segmental and somatic dysfunction of cervical region: Secondary | ICD-10-CM | POA: Diagnosis not present

## 2023-05-05 DIAGNOSIS — M9902 Segmental and somatic dysfunction of thoracic region: Secondary | ICD-10-CM | POA: Diagnosis not present

## 2023-05-05 DIAGNOSIS — M5384 Other specified dorsopathies, thoracic region: Secondary | ICD-10-CM | POA: Diagnosis not present

## 2023-05-07 DIAGNOSIS — J301 Allergic rhinitis due to pollen: Secondary | ICD-10-CM | POA: Diagnosis not present

## 2023-05-07 DIAGNOSIS — J3081 Allergic rhinitis due to animal (cat) (dog) hair and dander: Secondary | ICD-10-CM | POA: Diagnosis not present

## 2023-05-07 DIAGNOSIS — J3089 Other allergic rhinitis: Secondary | ICD-10-CM | POA: Diagnosis not present

## 2023-05-08 DIAGNOSIS — M5384 Other specified dorsopathies, thoracic region: Secondary | ICD-10-CM | POA: Diagnosis not present

## 2023-05-08 DIAGNOSIS — M9901 Segmental and somatic dysfunction of cervical region: Secondary | ICD-10-CM | POA: Diagnosis not present

## 2023-05-08 DIAGNOSIS — M9902 Segmental and somatic dysfunction of thoracic region: Secondary | ICD-10-CM | POA: Diagnosis not present

## 2023-05-08 DIAGNOSIS — M50122 Cervical disc disorder at C5-C6 level with radiculopathy: Secondary | ICD-10-CM | POA: Diagnosis not present

## 2023-05-12 DIAGNOSIS — M9901 Segmental and somatic dysfunction of cervical region: Secondary | ICD-10-CM | POA: Diagnosis not present

## 2023-05-12 DIAGNOSIS — M5384 Other specified dorsopathies, thoracic region: Secondary | ICD-10-CM | POA: Diagnosis not present

## 2023-05-12 DIAGNOSIS — M9902 Segmental and somatic dysfunction of thoracic region: Secondary | ICD-10-CM | POA: Diagnosis not present

## 2023-05-12 DIAGNOSIS — M50122 Cervical disc disorder at C5-C6 level with radiculopathy: Secondary | ICD-10-CM | POA: Diagnosis not present

## 2023-05-14 DIAGNOSIS — J3089 Other allergic rhinitis: Secondary | ICD-10-CM | POA: Diagnosis not present

## 2023-05-14 DIAGNOSIS — J3081 Allergic rhinitis due to animal (cat) (dog) hair and dander: Secondary | ICD-10-CM | POA: Diagnosis not present

## 2023-05-14 DIAGNOSIS — J301 Allergic rhinitis due to pollen: Secondary | ICD-10-CM | POA: Diagnosis not present

## 2023-05-15 DIAGNOSIS — M50122 Cervical disc disorder at C5-C6 level with radiculopathy: Secondary | ICD-10-CM | POA: Diagnosis not present

## 2023-05-15 DIAGNOSIS — M9902 Segmental and somatic dysfunction of thoracic region: Secondary | ICD-10-CM | POA: Diagnosis not present

## 2023-05-15 DIAGNOSIS — M9901 Segmental and somatic dysfunction of cervical region: Secondary | ICD-10-CM | POA: Diagnosis not present

## 2023-05-15 DIAGNOSIS — M5384 Other specified dorsopathies, thoracic region: Secondary | ICD-10-CM | POA: Diagnosis not present

## 2023-05-19 DIAGNOSIS — M5384 Other specified dorsopathies, thoracic region: Secondary | ICD-10-CM | POA: Diagnosis not present

## 2023-05-19 DIAGNOSIS — M9902 Segmental and somatic dysfunction of thoracic region: Secondary | ICD-10-CM | POA: Diagnosis not present

## 2023-05-19 DIAGNOSIS — M50122 Cervical disc disorder at C5-C6 level with radiculopathy: Secondary | ICD-10-CM | POA: Diagnosis not present

## 2023-05-19 DIAGNOSIS — M9901 Segmental and somatic dysfunction of cervical region: Secondary | ICD-10-CM | POA: Diagnosis not present

## 2023-05-20 DIAGNOSIS — Z130 Encounter for screening for diseases of the blood and blood-forming organs and certain disorders involving the immune mechanism: Secondary | ICD-10-CM | POA: Diagnosis not present

## 2023-05-20 DIAGNOSIS — Z01419 Encounter for gynecological examination (general) (routine) without abnormal findings: Secondary | ICD-10-CM | POA: Diagnosis not present

## 2023-05-21 DIAGNOSIS — J3089 Other allergic rhinitis: Secondary | ICD-10-CM | POA: Diagnosis not present

## 2023-05-21 DIAGNOSIS — J301 Allergic rhinitis due to pollen: Secondary | ICD-10-CM | POA: Diagnosis not present

## 2023-05-21 DIAGNOSIS — J3081 Allergic rhinitis due to animal (cat) (dog) hair and dander: Secondary | ICD-10-CM | POA: Diagnosis not present

## 2023-05-22 DIAGNOSIS — M50122 Cervical disc disorder at C5-C6 level with radiculopathy: Secondary | ICD-10-CM | POA: Diagnosis not present

## 2023-05-22 DIAGNOSIS — M5384 Other specified dorsopathies, thoracic region: Secondary | ICD-10-CM | POA: Diagnosis not present

## 2023-05-22 DIAGNOSIS — M9901 Segmental and somatic dysfunction of cervical region: Secondary | ICD-10-CM | POA: Diagnosis not present

## 2023-05-22 DIAGNOSIS — M9902 Segmental and somatic dysfunction of thoracic region: Secondary | ICD-10-CM | POA: Diagnosis not present

## 2023-05-26 DIAGNOSIS — M9901 Segmental and somatic dysfunction of cervical region: Secondary | ICD-10-CM | POA: Diagnosis not present

## 2023-05-26 DIAGNOSIS — M5384 Other specified dorsopathies, thoracic region: Secondary | ICD-10-CM | POA: Diagnosis not present

## 2023-05-26 DIAGNOSIS — M9902 Segmental and somatic dysfunction of thoracic region: Secondary | ICD-10-CM | POA: Diagnosis not present

## 2023-05-26 DIAGNOSIS — M50122 Cervical disc disorder at C5-C6 level with radiculopathy: Secondary | ICD-10-CM | POA: Diagnosis not present

## 2023-05-28 DIAGNOSIS — J3081 Allergic rhinitis due to animal (cat) (dog) hair and dander: Secondary | ICD-10-CM | POA: Diagnosis not present

## 2023-05-28 DIAGNOSIS — J3089 Other allergic rhinitis: Secondary | ICD-10-CM | POA: Diagnosis not present

## 2023-05-28 DIAGNOSIS — J301 Allergic rhinitis due to pollen: Secondary | ICD-10-CM | POA: Diagnosis not present

## 2023-05-29 DIAGNOSIS — M9901 Segmental and somatic dysfunction of cervical region: Secondary | ICD-10-CM | POA: Diagnosis not present

## 2023-05-29 DIAGNOSIS — M5384 Other specified dorsopathies, thoracic region: Secondary | ICD-10-CM | POA: Diagnosis not present

## 2023-05-29 DIAGNOSIS — M50122 Cervical disc disorder at C5-C6 level with radiculopathy: Secondary | ICD-10-CM | POA: Diagnosis not present

## 2023-05-29 DIAGNOSIS — M9902 Segmental and somatic dysfunction of thoracic region: Secondary | ICD-10-CM | POA: Diagnosis not present

## 2023-06-02 DIAGNOSIS — M9901 Segmental and somatic dysfunction of cervical region: Secondary | ICD-10-CM | POA: Diagnosis not present

## 2023-06-02 DIAGNOSIS — M9902 Segmental and somatic dysfunction of thoracic region: Secondary | ICD-10-CM | POA: Diagnosis not present

## 2023-06-02 DIAGNOSIS — M50122 Cervical disc disorder at C5-C6 level with radiculopathy: Secondary | ICD-10-CM | POA: Diagnosis not present

## 2023-06-02 DIAGNOSIS — M5384 Other specified dorsopathies, thoracic region: Secondary | ICD-10-CM | POA: Diagnosis not present

## 2023-06-03 DIAGNOSIS — S92514A Nondisplaced fracture of proximal phalanx of right lesser toe(s), initial encounter for closed fracture: Secondary | ICD-10-CM | POA: Diagnosis not present

## 2023-06-04 DIAGNOSIS — J3089 Other allergic rhinitis: Secondary | ICD-10-CM | POA: Diagnosis not present

## 2023-06-04 DIAGNOSIS — J301 Allergic rhinitis due to pollen: Secondary | ICD-10-CM | POA: Diagnosis not present

## 2023-06-04 DIAGNOSIS — J3081 Allergic rhinitis due to animal (cat) (dog) hair and dander: Secondary | ICD-10-CM | POA: Diagnosis not present

## 2023-06-06 DIAGNOSIS — J3081 Allergic rhinitis due to animal (cat) (dog) hair and dander: Secondary | ICD-10-CM | POA: Diagnosis not present

## 2023-06-06 DIAGNOSIS — J3089 Other allergic rhinitis: Secondary | ICD-10-CM | POA: Diagnosis not present

## 2023-06-08 DIAGNOSIS — G4733 Obstructive sleep apnea (adult) (pediatric): Secondary | ICD-10-CM | POA: Diagnosis not present

## 2023-06-09 DIAGNOSIS — M50122 Cervical disc disorder at C5-C6 level with radiculopathy: Secondary | ICD-10-CM | POA: Diagnosis not present

## 2023-06-09 DIAGNOSIS — M5384 Other specified dorsopathies, thoracic region: Secondary | ICD-10-CM | POA: Diagnosis not present

## 2023-06-09 DIAGNOSIS — M9901 Segmental and somatic dysfunction of cervical region: Secondary | ICD-10-CM | POA: Diagnosis not present

## 2023-06-09 DIAGNOSIS — M9902 Segmental and somatic dysfunction of thoracic region: Secondary | ICD-10-CM | POA: Diagnosis not present

## 2023-06-11 DIAGNOSIS — J301 Allergic rhinitis due to pollen: Secondary | ICD-10-CM | POA: Diagnosis not present

## 2023-06-11 DIAGNOSIS — J3081 Allergic rhinitis due to animal (cat) (dog) hair and dander: Secondary | ICD-10-CM | POA: Diagnosis not present

## 2023-06-11 DIAGNOSIS — J3089 Other allergic rhinitis: Secondary | ICD-10-CM | POA: Diagnosis not present

## 2023-06-16 DIAGNOSIS — M5384 Other specified dorsopathies, thoracic region: Secondary | ICD-10-CM | POA: Diagnosis not present

## 2023-06-16 DIAGNOSIS — Z6825 Body mass index (BMI) 25.0-25.9, adult: Secondary | ICD-10-CM | POA: Diagnosis not present

## 2023-06-16 DIAGNOSIS — M9901 Segmental and somatic dysfunction of cervical region: Secondary | ICD-10-CM | POA: Diagnosis not present

## 2023-06-16 DIAGNOSIS — Z9884 Bariatric surgery status: Secondary | ICD-10-CM | POA: Diagnosis not present

## 2023-06-16 DIAGNOSIS — Z713 Dietary counseling and surveillance: Secondary | ICD-10-CM | POA: Diagnosis not present

## 2023-06-16 DIAGNOSIS — M9902 Segmental and somatic dysfunction of thoracic region: Secondary | ICD-10-CM | POA: Diagnosis not present

## 2023-06-16 DIAGNOSIS — M50122 Cervical disc disorder at C5-C6 level with radiculopathy: Secondary | ICD-10-CM | POA: Diagnosis not present

## 2023-06-18 DIAGNOSIS — J3081 Allergic rhinitis due to animal (cat) (dog) hair and dander: Secondary | ICD-10-CM | POA: Diagnosis not present

## 2023-06-18 DIAGNOSIS — J3089 Other allergic rhinitis: Secondary | ICD-10-CM | POA: Diagnosis not present

## 2023-06-18 DIAGNOSIS — J301 Allergic rhinitis due to pollen: Secondary | ICD-10-CM | POA: Diagnosis not present

## 2023-06-26 DIAGNOSIS — J3081 Allergic rhinitis due to animal (cat) (dog) hair and dander: Secondary | ICD-10-CM | POA: Diagnosis not present

## 2023-06-26 DIAGNOSIS — M5384 Other specified dorsopathies, thoracic region: Secondary | ICD-10-CM | POA: Diagnosis not present

## 2023-06-26 DIAGNOSIS — J3089 Other allergic rhinitis: Secondary | ICD-10-CM | POA: Diagnosis not present

## 2023-06-26 DIAGNOSIS — M9902 Segmental and somatic dysfunction of thoracic region: Secondary | ICD-10-CM | POA: Diagnosis not present

## 2023-06-26 DIAGNOSIS — J301 Allergic rhinitis due to pollen: Secondary | ICD-10-CM | POA: Diagnosis not present

## 2023-06-26 DIAGNOSIS — S92514D Nondisplaced fracture of proximal phalanx of right lesser toe(s), subsequent encounter for fracture with routine healing: Secondary | ICD-10-CM | POA: Diagnosis not present

## 2023-06-26 DIAGNOSIS — M21612 Bunion of left foot: Secondary | ICD-10-CM | POA: Diagnosis not present

## 2023-06-26 DIAGNOSIS — M50122 Cervical disc disorder at C5-C6 level with radiculopathy: Secondary | ICD-10-CM | POA: Diagnosis not present

## 2023-06-26 DIAGNOSIS — M9901 Segmental and somatic dysfunction of cervical region: Secondary | ICD-10-CM | POA: Diagnosis not present

## 2023-06-30 DIAGNOSIS — Z713 Dietary counseling and surveillance: Secondary | ICD-10-CM | POA: Diagnosis not present

## 2023-07-02 DIAGNOSIS — J3089 Other allergic rhinitis: Secondary | ICD-10-CM | POA: Diagnosis not present

## 2023-07-02 DIAGNOSIS — J301 Allergic rhinitis due to pollen: Secondary | ICD-10-CM | POA: Diagnosis not present

## 2023-07-02 DIAGNOSIS — J3081 Allergic rhinitis due to animal (cat) (dog) hair and dander: Secondary | ICD-10-CM | POA: Diagnosis not present

## 2023-07-18 DIAGNOSIS — J3089 Other allergic rhinitis: Secondary | ICD-10-CM | POA: Diagnosis not present

## 2023-07-18 DIAGNOSIS — J301 Allergic rhinitis due to pollen: Secondary | ICD-10-CM | POA: Diagnosis not present

## 2023-07-18 DIAGNOSIS — J3081 Allergic rhinitis due to animal (cat) (dog) hair and dander: Secondary | ICD-10-CM | POA: Diagnosis not present

## 2023-07-23 DIAGNOSIS — J3089 Other allergic rhinitis: Secondary | ICD-10-CM | POA: Diagnosis not present

## 2023-07-23 DIAGNOSIS — J301 Allergic rhinitis due to pollen: Secondary | ICD-10-CM | POA: Diagnosis not present

## 2023-07-23 DIAGNOSIS — J3081 Allergic rhinitis due to animal (cat) (dog) hair and dander: Secondary | ICD-10-CM | POA: Diagnosis not present

## 2023-07-30 DIAGNOSIS — J301 Allergic rhinitis due to pollen: Secondary | ICD-10-CM | POA: Diagnosis not present

## 2023-07-30 DIAGNOSIS — J3081 Allergic rhinitis due to animal (cat) (dog) hair and dander: Secondary | ICD-10-CM | POA: Diagnosis not present

## 2023-07-30 DIAGNOSIS — J3089 Other allergic rhinitis: Secondary | ICD-10-CM | POA: Diagnosis not present

## 2023-08-06 DIAGNOSIS — J301 Allergic rhinitis due to pollen: Secondary | ICD-10-CM | POA: Diagnosis not present

## 2023-08-13 DIAGNOSIS — J3089 Other allergic rhinitis: Secondary | ICD-10-CM | POA: Diagnosis not present

## 2023-08-13 DIAGNOSIS — J301 Allergic rhinitis due to pollen: Secondary | ICD-10-CM | POA: Diagnosis not present

## 2023-08-13 DIAGNOSIS — J3081 Allergic rhinitis due to animal (cat) (dog) hair and dander: Secondary | ICD-10-CM | POA: Diagnosis not present

## 2023-08-20 DIAGNOSIS — J3089 Other allergic rhinitis: Secondary | ICD-10-CM | POA: Diagnosis not present

## 2023-08-20 DIAGNOSIS — J3081 Allergic rhinitis due to animal (cat) (dog) hair and dander: Secondary | ICD-10-CM | POA: Diagnosis not present

## 2023-08-20 DIAGNOSIS — J301 Allergic rhinitis due to pollen: Secondary | ICD-10-CM | POA: Diagnosis not present

## 2023-08-25 DIAGNOSIS — Z1231 Encounter for screening mammogram for malignant neoplasm of breast: Secondary | ICD-10-CM | POA: Diagnosis not present

## 2023-08-27 DIAGNOSIS — J301 Allergic rhinitis due to pollen: Secondary | ICD-10-CM | POA: Diagnosis not present

## 2023-08-27 DIAGNOSIS — J3089 Other allergic rhinitis: Secondary | ICD-10-CM | POA: Diagnosis not present

## 2023-08-27 DIAGNOSIS — J3081 Allergic rhinitis due to animal (cat) (dog) hair and dander: Secondary | ICD-10-CM | POA: Diagnosis not present

## 2023-09-03 DIAGNOSIS — J301 Allergic rhinitis due to pollen: Secondary | ICD-10-CM | POA: Diagnosis not present

## 2023-09-03 DIAGNOSIS — J3081 Allergic rhinitis due to animal (cat) (dog) hair and dander: Secondary | ICD-10-CM | POA: Diagnosis not present

## 2023-09-03 DIAGNOSIS — J3089 Other allergic rhinitis: Secondary | ICD-10-CM | POA: Diagnosis not present

## 2023-09-08 DIAGNOSIS — G4733 Obstructive sleep apnea (adult) (pediatric): Secondary | ICD-10-CM | POA: Diagnosis not present

## 2023-09-10 DIAGNOSIS — J301 Allergic rhinitis due to pollen: Secondary | ICD-10-CM | POA: Diagnosis not present

## 2023-09-10 DIAGNOSIS — J3081 Allergic rhinitis due to animal (cat) (dog) hair and dander: Secondary | ICD-10-CM | POA: Diagnosis not present

## 2023-09-10 DIAGNOSIS — J3089 Other allergic rhinitis: Secondary | ICD-10-CM | POA: Diagnosis not present

## 2023-09-15 DIAGNOSIS — I10 Essential (primary) hypertension: Secondary | ICD-10-CM | POA: Diagnosis not present

## 2023-09-15 DIAGNOSIS — Z6828 Body mass index (BMI) 28.0-28.9, adult: Secondary | ICD-10-CM | POA: Diagnosis not present

## 2023-09-15 DIAGNOSIS — E669 Obesity, unspecified: Secondary | ICD-10-CM | POA: Diagnosis not present

## 2023-09-15 DIAGNOSIS — Z9884 Bariatric surgery status: Secondary | ICD-10-CM | POA: Diagnosis not present

## 2023-09-17 DIAGNOSIS — J3089 Other allergic rhinitis: Secondary | ICD-10-CM | POA: Diagnosis not present

## 2023-09-17 DIAGNOSIS — J3081 Allergic rhinitis due to animal (cat) (dog) hair and dander: Secondary | ICD-10-CM | POA: Diagnosis not present

## 2023-09-17 DIAGNOSIS — J301 Allergic rhinitis due to pollen: Secondary | ICD-10-CM | POA: Diagnosis not present

## 2023-09-24 DIAGNOSIS — J301 Allergic rhinitis due to pollen: Secondary | ICD-10-CM | POA: Diagnosis not present

## 2023-09-24 DIAGNOSIS — J3081 Allergic rhinitis due to animal (cat) (dog) hair and dander: Secondary | ICD-10-CM | POA: Diagnosis not present

## 2023-09-24 DIAGNOSIS — J3089 Other allergic rhinitis: Secondary | ICD-10-CM | POA: Diagnosis not present

## 2023-10-01 DIAGNOSIS — J3081 Allergic rhinitis due to animal (cat) (dog) hair and dander: Secondary | ICD-10-CM | POA: Diagnosis not present

## 2023-10-01 DIAGNOSIS — J301 Allergic rhinitis due to pollen: Secondary | ICD-10-CM | POA: Diagnosis not present

## 2023-10-01 DIAGNOSIS — E669 Obesity, unspecified: Secondary | ICD-10-CM | POA: Diagnosis not present

## 2023-10-01 DIAGNOSIS — Z6828 Body mass index (BMI) 28.0-28.9, adult: Secondary | ICD-10-CM | POA: Diagnosis not present

## 2023-10-01 DIAGNOSIS — J3089 Other allergic rhinitis: Secondary | ICD-10-CM | POA: Diagnosis not present

## 2023-10-01 DIAGNOSIS — Z713 Dietary counseling and surveillance: Secondary | ICD-10-CM | POA: Diagnosis not present

## 2023-10-01 DIAGNOSIS — Z9884 Bariatric surgery status: Secondary | ICD-10-CM | POA: Diagnosis not present

## 2023-10-07 DIAGNOSIS — E785 Hyperlipidemia, unspecified: Secondary | ICD-10-CM | POA: Diagnosis not present

## 2023-10-07 DIAGNOSIS — E1159 Type 2 diabetes mellitus with other circulatory complications: Secondary | ICD-10-CM | POA: Diagnosis not present

## 2023-10-07 DIAGNOSIS — Z79899 Other long term (current) drug therapy: Secondary | ICD-10-CM | POA: Diagnosis not present

## 2023-10-08 DIAGNOSIS — J3089 Other allergic rhinitis: Secondary | ICD-10-CM | POA: Diagnosis not present

## 2023-10-08 DIAGNOSIS — J301 Allergic rhinitis due to pollen: Secondary | ICD-10-CM | POA: Diagnosis not present

## 2023-10-08 DIAGNOSIS — J3081 Allergic rhinitis due to animal (cat) (dog) hair and dander: Secondary | ICD-10-CM | POA: Diagnosis not present

## 2023-10-09 DIAGNOSIS — E785 Hyperlipidemia, unspecified: Secondary | ICD-10-CM | POA: Diagnosis not present

## 2023-10-09 DIAGNOSIS — E1159 Type 2 diabetes mellitus with other circulatory complications: Secondary | ICD-10-CM | POA: Diagnosis not present

## 2023-10-09 DIAGNOSIS — Z0000 Encounter for general adult medical examination without abnormal findings: Secondary | ICD-10-CM | POA: Diagnosis not present

## 2023-10-09 DIAGNOSIS — I10 Essential (primary) hypertension: Secondary | ICD-10-CM | POA: Diagnosis not present

## 2023-10-09 DIAGNOSIS — E663 Overweight: Secondary | ICD-10-CM | POA: Diagnosis not present

## 2023-10-15 DIAGNOSIS — J301 Allergic rhinitis due to pollen: Secondary | ICD-10-CM | POA: Diagnosis not present

## 2023-10-15 DIAGNOSIS — J3081 Allergic rhinitis due to animal (cat) (dog) hair and dander: Secondary | ICD-10-CM | POA: Diagnosis not present

## 2023-10-15 DIAGNOSIS — J3089 Other allergic rhinitis: Secondary | ICD-10-CM | POA: Diagnosis not present

## 2023-10-22 DIAGNOSIS — J301 Allergic rhinitis due to pollen: Secondary | ICD-10-CM | POA: Diagnosis not present

## 2023-10-22 DIAGNOSIS — J3081 Allergic rhinitis due to animal (cat) (dog) hair and dander: Secondary | ICD-10-CM | POA: Diagnosis not present

## 2023-10-22 DIAGNOSIS — J3089 Other allergic rhinitis: Secondary | ICD-10-CM | POA: Diagnosis not present

## 2023-10-29 DIAGNOSIS — I10 Essential (primary) hypertension: Secondary | ICD-10-CM | POA: Diagnosis not present

## 2023-10-29 DIAGNOSIS — J301 Allergic rhinitis due to pollen: Secondary | ICD-10-CM | POA: Diagnosis not present

## 2023-10-29 DIAGNOSIS — G4700 Insomnia, unspecified: Secondary | ICD-10-CM | POA: Diagnosis not present

## 2023-10-29 DIAGNOSIS — J3081 Allergic rhinitis due to animal (cat) (dog) hair and dander: Secondary | ICD-10-CM | POA: Diagnosis not present

## 2023-10-29 DIAGNOSIS — G4733 Obstructive sleep apnea (adult) (pediatric): Secondary | ICD-10-CM | POA: Diagnosis not present

## 2023-10-29 DIAGNOSIS — J3089 Other allergic rhinitis: Secondary | ICD-10-CM | POA: Diagnosis not present

## 2023-11-04 DIAGNOSIS — J3081 Allergic rhinitis due to animal (cat) (dog) hair and dander: Secondary | ICD-10-CM | POA: Diagnosis not present

## 2023-11-04 DIAGNOSIS — J301 Allergic rhinitis due to pollen: Secondary | ICD-10-CM | POA: Diagnosis not present

## 2023-11-04 DIAGNOSIS — H1045 Other chronic allergic conjunctivitis: Secondary | ICD-10-CM | POA: Diagnosis not present

## 2023-11-04 DIAGNOSIS — J3089 Other allergic rhinitis: Secondary | ICD-10-CM | POA: Diagnosis not present

## 2023-11-12 DIAGNOSIS — J3081 Allergic rhinitis due to animal (cat) (dog) hair and dander: Secondary | ICD-10-CM | POA: Diagnosis not present

## 2023-11-12 DIAGNOSIS — J301 Allergic rhinitis due to pollen: Secondary | ICD-10-CM | POA: Diagnosis not present

## 2023-11-12 DIAGNOSIS — J3089 Other allergic rhinitis: Secondary | ICD-10-CM | POA: Diagnosis not present

## 2023-11-17 DIAGNOSIS — Z9884 Bariatric surgery status: Secondary | ICD-10-CM | POA: Diagnosis not present

## 2023-11-17 DIAGNOSIS — Z713 Dietary counseling and surveillance: Secondary | ICD-10-CM | POA: Diagnosis not present

## 2023-11-17 DIAGNOSIS — E663 Overweight: Secondary | ICD-10-CM | POA: Diagnosis not present

## 2023-11-17 DIAGNOSIS — E119 Type 2 diabetes mellitus without complications: Secondary | ICD-10-CM | POA: Diagnosis not present

## 2023-11-19 DIAGNOSIS — J3081 Allergic rhinitis due to animal (cat) (dog) hair and dander: Secondary | ICD-10-CM | POA: Diagnosis not present

## 2023-11-19 DIAGNOSIS — J301 Allergic rhinitis due to pollen: Secondary | ICD-10-CM | POA: Diagnosis not present

## 2023-11-19 DIAGNOSIS — J3089 Other allergic rhinitis: Secondary | ICD-10-CM | POA: Diagnosis not present

## 2023-12-03 DIAGNOSIS — J3081 Allergic rhinitis due to animal (cat) (dog) hair and dander: Secondary | ICD-10-CM | POA: Diagnosis not present

## 2023-12-03 DIAGNOSIS — J3089 Other allergic rhinitis: Secondary | ICD-10-CM | POA: Diagnosis not present

## 2023-12-03 DIAGNOSIS — J301 Allergic rhinitis due to pollen: Secondary | ICD-10-CM | POA: Diagnosis not present

## 2023-12-10 DIAGNOSIS — J3089 Other allergic rhinitis: Secondary | ICD-10-CM | POA: Diagnosis not present

## 2023-12-10 DIAGNOSIS — J301 Allergic rhinitis due to pollen: Secondary | ICD-10-CM | POA: Diagnosis not present

## 2023-12-17 DIAGNOSIS — J301 Allergic rhinitis due to pollen: Secondary | ICD-10-CM | POA: Diagnosis not present

## 2023-12-17 DIAGNOSIS — J3081 Allergic rhinitis due to animal (cat) (dog) hair and dander: Secondary | ICD-10-CM | POA: Diagnosis not present

## 2023-12-17 DIAGNOSIS — J3089 Other allergic rhinitis: Secondary | ICD-10-CM | POA: Diagnosis not present

## 2023-12-19 DIAGNOSIS — J3089 Other allergic rhinitis: Secondary | ICD-10-CM | POA: Diagnosis not present

## 2023-12-19 DIAGNOSIS — J3081 Allergic rhinitis due to animal (cat) (dog) hair and dander: Secondary | ICD-10-CM | POA: Diagnosis not present

## 2023-12-24 DIAGNOSIS — J3081 Allergic rhinitis due to animal (cat) (dog) hair and dander: Secondary | ICD-10-CM | POA: Diagnosis not present

## 2023-12-24 DIAGNOSIS — J301 Allergic rhinitis due to pollen: Secondary | ICD-10-CM | POA: Diagnosis not present

## 2023-12-24 DIAGNOSIS — J3089 Other allergic rhinitis: Secondary | ICD-10-CM | POA: Diagnosis not present

## 2023-12-31 DIAGNOSIS — J3081 Allergic rhinitis due to animal (cat) (dog) hair and dander: Secondary | ICD-10-CM | POA: Diagnosis not present

## 2023-12-31 DIAGNOSIS — J301 Allergic rhinitis due to pollen: Secondary | ICD-10-CM | POA: Diagnosis not present

## 2023-12-31 DIAGNOSIS — J3089 Other allergic rhinitis: Secondary | ICD-10-CM | POA: Diagnosis not present

## 2024-01-05 DIAGNOSIS — Z9884 Bariatric surgery status: Secondary | ICD-10-CM | POA: Diagnosis not present

## 2024-01-05 DIAGNOSIS — E669 Obesity, unspecified: Secondary | ICD-10-CM | POA: Diagnosis not present

## 2024-01-05 DIAGNOSIS — Z6827 Body mass index (BMI) 27.0-27.9, adult: Secondary | ICD-10-CM | POA: Diagnosis not present

## 2024-01-05 DIAGNOSIS — Z713 Dietary counseling and surveillance: Secondary | ICD-10-CM | POA: Diagnosis not present

## 2024-01-14 DIAGNOSIS — J301 Allergic rhinitis due to pollen: Secondary | ICD-10-CM | POA: Diagnosis not present

## 2024-01-14 DIAGNOSIS — J3089 Other allergic rhinitis: Secondary | ICD-10-CM | POA: Diagnosis not present

## 2024-01-14 DIAGNOSIS — J3081 Allergic rhinitis due to animal (cat) (dog) hair and dander: Secondary | ICD-10-CM | POA: Diagnosis not present

## 2024-01-20 DIAGNOSIS — Z713 Dietary counseling and surveillance: Secondary | ICD-10-CM | POA: Diagnosis not present

## 2024-01-20 DIAGNOSIS — E119 Type 2 diabetes mellitus without complications: Secondary | ICD-10-CM | POA: Diagnosis not present

## 2024-01-20 DIAGNOSIS — Z9884 Bariatric surgery status: Secondary | ICD-10-CM | POA: Diagnosis not present

## 2024-01-20 DIAGNOSIS — E663 Overweight: Secondary | ICD-10-CM | POA: Diagnosis not present

## 2024-01-21 DIAGNOSIS — J3089 Other allergic rhinitis: Secondary | ICD-10-CM | POA: Diagnosis not present

## 2024-01-21 DIAGNOSIS — J3081 Allergic rhinitis due to animal (cat) (dog) hair and dander: Secondary | ICD-10-CM | POA: Diagnosis not present

## 2024-01-21 DIAGNOSIS — J301 Allergic rhinitis due to pollen: Secondary | ICD-10-CM | POA: Diagnosis not present

## 2024-01-27 DIAGNOSIS — J3081 Allergic rhinitis due to animal (cat) (dog) hair and dander: Secondary | ICD-10-CM | POA: Diagnosis not present

## 2024-01-27 DIAGNOSIS — J3089 Other allergic rhinitis: Secondary | ICD-10-CM | POA: Diagnosis not present

## 2024-01-27 DIAGNOSIS — J301 Allergic rhinitis due to pollen: Secondary | ICD-10-CM | POA: Diagnosis not present

## 2024-02-03 DIAGNOSIS — J301 Allergic rhinitis due to pollen: Secondary | ICD-10-CM | POA: Diagnosis not present

## 2024-02-03 DIAGNOSIS — J3089 Other allergic rhinitis: Secondary | ICD-10-CM | POA: Diagnosis not present

## 2024-02-03 DIAGNOSIS — J3081 Allergic rhinitis due to animal (cat) (dog) hair and dander: Secondary | ICD-10-CM | POA: Diagnosis not present

## 2024-02-11 DIAGNOSIS — J3089 Other allergic rhinitis: Secondary | ICD-10-CM | POA: Diagnosis not present

## 2024-02-11 DIAGNOSIS — J301 Allergic rhinitis due to pollen: Secondary | ICD-10-CM | POA: Diagnosis not present

## 2024-02-11 DIAGNOSIS — J3081 Allergic rhinitis due to animal (cat) (dog) hair and dander: Secondary | ICD-10-CM | POA: Diagnosis not present
